# Patient Record
Sex: Female | Born: 1970 | Race: White | Hispanic: No | Marital: Single | Smoking: Current every day smoker
Health system: Southern US, Community
[De-identification: ages and names within clinical notes are randomized; demographics above are authoritative.]

## PROBLEM LIST (undated history)

## (undated) DIAGNOSIS — K8689 Other specified diseases of pancreas: Secondary | ICD-10-CM

## (undated) DIAGNOSIS — Z87898 Personal history of other specified conditions: Secondary | ICD-10-CM

## (undated) DIAGNOSIS — I81 Portal vein thrombosis: Secondary | ICD-10-CM

## (undated) HISTORY — PX: OTHER SURGICAL HISTORY: SHX169

## (undated) HISTORY — PX: ECTOPIC PREGNANCY SURGERY: SHX613

---

## 2017-07-27 ENCOUNTER — Other Ambulatory Visit: Payer: Self-pay

## 2017-07-27 ENCOUNTER — Encounter (HOSPITAL_COMMUNITY): Payer: Self-pay | Admitting: *Deleted

## 2017-07-27 ENCOUNTER — Emergency Department (HOSPITAL_COMMUNITY)
Admission: EM | Admit: 2017-07-27 | Discharge: 2017-07-27 | Disposition: A | Discharge status: Home / Self Care | Payer: Self-pay | Attending: Emergency Medicine | Admitting: Emergency Medicine

## 2017-07-27 ENCOUNTER — Emergency Department (HOSPITAL_COMMUNITY): Payer: Self-pay

## 2017-07-27 DIAGNOSIS — S93114A Dislocation of interphalangeal joint of right lesser toe(s), initial encounter: Secondary | ICD-10-CM | POA: Insufficient documentation

## 2017-07-27 DIAGNOSIS — Y999 Unspecified external cause status: Secondary | ICD-10-CM | POA: Insufficient documentation

## 2017-07-27 DIAGNOSIS — Y9389 Activity, other specified: Secondary | ICD-10-CM | POA: Insufficient documentation

## 2017-07-27 DIAGNOSIS — Y92003 Bedroom of unspecified non-institutional (private) residence as the place of occurrence of the external cause: Secondary | ICD-10-CM | POA: Insufficient documentation

## 2017-07-27 DIAGNOSIS — W010XXA Fall on same level from slipping, tripping and stumbling without subsequent striking against object, initial encounter: Secondary | ICD-10-CM | POA: Insufficient documentation

## 2017-07-27 DIAGNOSIS — S93104A Unspecified dislocation of right toe(s), initial encounter: Secondary | ICD-10-CM

## 2017-07-27 DIAGNOSIS — F17210 Nicotine dependence, cigarettes, uncomplicated: Secondary | ICD-10-CM | POA: Insufficient documentation

## 2017-07-27 MED ORDER — LIDOCAINE HCL (PF) 1 % IJ SOLN
5.0000 mL | Freq: Once | INTRAMUSCULAR | Status: AC
  Administered 2017-07-27: 5 mL
  Filled 2017-07-27: qty 5

## 2017-07-27 NOTE — ED Provider Notes (Signed)
[facility] EMERGENCY DEPARTMENT Provider Note   CSN: [id number] Arrival date & time: 07/27/17  1445     History   Chief Complaint Chief Complaint  Patient presents with  . Toe Injury    HPI Meredith Cohen is a 47 y.o. female who is previously healthy who presents with right small toe pain since last night.  Patient reports she got out of bed to go to the bathroom in the early morning hours of yesterday when her toe caught on the blanket and cause her to fall.  Her toe twisted in the blanket.  She did not hit her head or lose consciousness.  She denies any other injuries.  Her toe has had persistent swelling and pain.  She has taken ibuprofen at home without relief.  HPI  History reviewed. No pertinent past medical history.  There are no active problems to display for this patient.   History reviewed. No pertinent surgical history.   OB History   None      Home Medications    Prior to Admission medications   Not on File    Family History History reviewed. No pertinent family history.  Social History Social History   Tobacco Use  . Smoking status: Current Every Day Smoker  Substance Use Topics  . Alcohol use: Yes  . Drug use: Never     Allergies   Patient has no known allergies.   Review of Systems Review of Systems  Musculoskeletal: Positive for arthralgias and joint swelling.  Skin: Negative for wound.  Neurological: Negative for numbness.     Physical Exam Updated Vital Signs BP 115/88   Pulse 88   Temp 98.7 F (37.1 C) (Oral)   Resp 16   Ht  (1.676 m)   Wt 61.2 kg (135 lb)   SpO2 100%   BMI 21.79 kg/m   Physical Exam  Constitutional: She appears well-developed and well-nourished. No distress.  HENT:  Head: Normocephalic and atraumatic.  Mouth/Throat: Oropharynx is clear and moist. No oropharyngeal exudate.  Eyes: Pupils are equal, round, and reactive to light. Conjunctivae are normal. Right eye exhibits no  discharge. Left eye exhibits no discharge. No scleral icterus.  Neck: Normal range of motion. Neck supple.  Cardiovascular: Normal rate, regular rhythm, normal heart sounds and intact distal pulses. Exam reveals no gallop and no friction rub.  No murmur heard. Pulmonary/Chest: Effort normal and breath sounds normal. No stridor. No respiratory distress. She has no wheezes. She has no rales.  Musculoskeletal: She exhibits no edema.  Deformity noted to right fifth toe, tenderness on palpation, edema and mild ecchymosis noted Patient unable to significantly move the toe, sensation intact, cap refill less than 2 seconds  Neurological: She is alert. Coordination normal.  Skin: Skin is warm and dry. No rash noted. She is not diaphoretic. No pallor.  Psychiatric: She has a normal mood and affect.  Nursing note and vitals reviewed.    ED Treatments / Results  Labs (all labs ordered are listed, but only abnormal results are displayed) Labs Reviewed - No data to display  EKG None  Radiology Dg Foot Complete Right  Result Date: 07/27/2017 CLINICAL DATA:  Pain following fall EXAM: RIGHT FOOT COMPLETE - 3+ VIEW COMPARISON:  None. FINDINGS: Frontal, oblique, and lateral views were obtained. There is subluxation at the fifth PIP joint with the fifth middle and distal phalanges subluxed somewhat lateral to the proximal phalanx. There is not appear to be frank dislocation at  this site. No fracture is evident. No dislocation. There is mild osteoarthritic change at the first MTP joint with mild hallux valgus deformity at the first MTP joint. There is early bunion formation medially in the first MTP joint region. Other joint spaces appear normal. IMPRESSION: Subluxation at the fifth PIP joint with lateral subluxation of the middle and distal phalanges of fifth digit compared to the proximal phalanx. No frank dislocation. No fractures are evident. 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 Electronically Signed   By: Bretta Bang III M.D.   On: 07/27/2017 15:35   Dg Toe 5th Right  Result Date: 07/27/2017 CLINICAL DATA:  Post reduction for subluxation EXAM: RIGHT FIFTH TOE: 3 V COMPARISON:  Study obtained earlier in the day FINDINGS: Frontal, oblique and lateral views obtained. There is a bandage overlying the fifth digit. The previously noted subluxation at the fifth PIP joint has resolved. No fracture or dislocation evident. No appreciable joint space narrowing or erosion. IMPRESSION: Bandage overlies the fifth digit. Reduction fifth PIP joint subluxation. Currently no fracture or dislocation. No appreciable arthropathy. Electronically Signed   By: Bretta Bang III M.D.   On: 07/27/2017 19:24    Procedures Reduction of dislocation Date/Time: 07/27/2017 10:32 PM Performed by: Emi Holes, PA-C Authorized by: Emi Holes, PA-C  Consent: Verbal consent obtained. Consent given by: patient Imaging studies: imaging studies available Patient identity confirmed: verbally with patient Local anesthesia used: yes Anesthesia: digital block  Anesthesia: Local anesthesia used: yes Local Anesthetic: lidocaine 1% without epinephrine Anesthetic total: 1 mL  Sedation: Patient sedated: no  Patient tolerance: Patient tolerated the procedure well with no immediate complications Comments: Reduction of the fifth PIP of the right foot performed by me after digital block of the toe.  Postreduction films showed successful reduction.    (including critical care time)  Medications Ordered in ED Medications  lidocaine (PF) (XYLOCAINE) 1 % injection 5 mL (5 mLs Infiltration Given by Other 07/27/17 1905)     Initial Impression / Assessment and Plan / ED Course  I have reviewed the triage vital signs and the nursing notes.  Pertinent labs & imaging results that were available during my care of the patient were reviewed  by me and considered in my medical decision making (see chart for details).     Patient with subluxation at the PIP joint of the fifth digit on the right foot.  There is no fracture.  Toe was reduced in the ED after digital block performed by me.  Postreduction film shows reduction of the fifth PIP joint subluxation.  Patient was buddy taped, given postop shoe, and crutches with follow-up to orthopedics.  Return precautions discussed.  Patient understands and agrees with plan.  Patient vitals stable throughout ED course and discharged in satisfactory condition.  Final Clinical Impressions(s) / ED Diagnoses   Final diagnoses:  Dislocation of phalanx of right foot, initial encounter    ED Discharge Orders    None       Emi Holes, PA-C 07/27/17 2233    Arby Barrette, MD 08/06/17 1443

## 2017-07-27 NOTE — ED Notes (Signed)
X RAY at bedside 

## 2017-07-27 NOTE — ED Triage Notes (Signed)
Pt reports injuring right little toe last night, still has pain and swelling. Ambulatory at triage.

## 2017-07-27 NOTE — Discharge Instructions (Signed)
Keep your toes buddy taped at all times.  You can use ice 3-4 times daily alternating 20 minutes on, 20 minutes off to help with pain and swelling.  Please follow-up with Dr. Aundria Rud, the orthopedic doctor, for recheck in 1 week.  Please return the emergency department if you develop any new or worsening symptoms.

## 2017-07-27 NOTE — ED Notes (Signed)
Patient Alert and oriented to baseline. Stable and ambulatory to baseline. Patient verbalized understanding of the discharge instructions.  Patient belongings were taken by the patient.   

## 2017-11-14 ENCOUNTER — Inpatient Hospital Stay (HOSPITAL_COMMUNITY)
Admission: EM | Admit: 2017-11-14 | Discharge: 2017-11-20 | DRG: 440 | Disposition: A | Discharge status: Home / Self Care | Payer: Self-pay | Attending: Internal Medicine | Admitting: Internal Medicine

## 2017-11-14 ENCOUNTER — Encounter (HOSPITAL_COMMUNITY): Payer: Self-pay

## 2017-11-14 ENCOUNTER — Other Ambulatory Visit: Payer: Self-pay

## 2017-11-14 ENCOUNTER — Emergency Department (HOSPITAL_COMMUNITY): Payer: Self-pay

## 2017-11-14 DIAGNOSIS — M5430 Sciatica, unspecified side: Secondary | ICD-10-CM | POA: Diagnosis present

## 2017-11-14 DIAGNOSIS — Z87448 Personal history of other diseases of urinary system: Secondary | ICD-10-CM

## 2017-11-14 DIAGNOSIS — M549 Dorsalgia, unspecified: Secondary | ICD-10-CM | POA: Diagnosis present

## 2017-11-14 DIAGNOSIS — R8290 Unspecified abnormal findings in urine: Secondary | ICD-10-CM

## 2017-11-14 DIAGNOSIS — K760 Fatty (change of) liver, not elsewhere classified: Secondary | ICD-10-CM

## 2017-11-14 DIAGNOSIS — K7460 Unspecified cirrhosis of liver: Secondary | ICD-10-CM | POA: Diagnosis present

## 2017-11-14 DIAGNOSIS — L818 Other specified disorders of pigmentation: Secondary | ICD-10-CM

## 2017-11-14 DIAGNOSIS — E785 Hyperlipidemia, unspecified: Secondary | ICD-10-CM | POA: Diagnosis present

## 2017-11-14 DIAGNOSIS — Z880 Allergy status to penicillin: Secondary | ICD-10-CM

## 2017-11-14 DIAGNOSIS — Z79899 Other long term (current) drug therapy: Secondary | ICD-10-CM

## 2017-11-14 DIAGNOSIS — R112 Nausea with vomiting, unspecified: Secondary | ICD-10-CM | POA: Diagnosis not present

## 2017-11-14 DIAGNOSIS — K8689 Other specified diseases of pancreas: Secondary | ICD-10-CM | POA: Diagnosis present

## 2017-11-14 DIAGNOSIS — K861 Other chronic pancreatitis: Secondary | ICD-10-CM | POA: Diagnosis present

## 2017-11-14 DIAGNOSIS — F17210 Nicotine dependence, cigarettes, uncomplicated: Secondary | ICD-10-CM | POA: Diagnosis present

## 2017-11-14 DIAGNOSIS — K859 Acute pancreatitis without necrosis or infection, unspecified: Secondary | ICD-10-CM | POA: Diagnosis present

## 2017-11-14 DIAGNOSIS — Z720 Tobacco use: Secondary | ICD-10-CM

## 2017-11-14 DIAGNOSIS — K869 Disease of pancreas, unspecified: Principal | ICD-10-CM | POA: Diagnosis present

## 2017-11-14 DIAGNOSIS — Z86718 Personal history of other venous thrombosis and embolism: Secondary | ICD-10-CM

## 2017-11-14 DIAGNOSIS — R82998 Other abnormal findings in urine: Secondary | ICD-10-CM

## 2017-11-14 DIAGNOSIS — R1012 Left upper quadrant pain: Secondary | ICD-10-CM

## 2017-11-14 DIAGNOSIS — F1011 Alcohol abuse, in remission: Secondary | ICD-10-CM

## 2017-11-14 HISTORY — DX: Personal history of other specified conditions: Z87.898

## 2017-11-14 HISTORY — DX: Portal vein thrombosis: I81

## 2017-11-14 LAB — CBC
HCT: 43.5 % (ref 36.0–46.0)
Hemoglobin: 14.7 g/dL (ref 12.0–15.0)
MCH: 33 pg (ref 26.0–34.0)
MCHC: 33.8 g/dL (ref 30.0–36.0)
MCV: 97.5 fL (ref 78.0–100.0)
PLATELETS: 201 10*3/uL (ref 150–400)
RBC: 4.46 MIL/uL (ref 3.87–5.11)
RDW: 12.8 % (ref 11.5–15.5)
WBC: 8.3 10*3/uL (ref 4.0–10.5)

## 2017-11-14 LAB — COMPREHENSIVE METABOLIC PANEL
ALK PHOS: 78 U/L (ref 38–126)
ALT: 16 U/L (ref 0–44)
AST: 26 U/L (ref 15–41)
Albumin: 3.9 g/dL (ref 3.5–5.0)
Anion gap: 9 (ref 5–15)
BUN: 12 mg/dL (ref 6–20)
CHLORIDE: 101 mmol/L (ref 98–111)
CO2: 25 mmol/L (ref 22–32)
CREATININE: 0.75 mg/dL (ref 0.44–1.00)
Calcium: 8.9 mg/dL (ref 8.9–10.3)
GFR calc Af Amer: >60 mL/min (ref >60)
Glucose, Bld: 101 mg/dL — ABNORMAL HIGH (ref 70–99)
Potassium: 3.6 mmol/L (ref 3.5–5.1)
Sodium: 135 mmol/L (ref 135–145)
TOTAL PROTEIN: 7.2 g/dL (ref 6.5–8.1)
Total Bilirubin: 0.7 mg/dL (ref 0.3–1.2)

## 2017-11-14 LAB — URINALYSIS, ROUTINE W REFLEX MICROSCOPIC
BILIRUBIN URINE: NEGATIVE
GLUCOSE, UA: NEGATIVE mg/dL
KETONES UR: 80 mg/dL — AB
Nitrite: NEGATIVE
PH: 6 (ref 5.0–8.0)
Protein, ur: 30 mg/dL — AB
SPECIFIC GRAVITY, URINE: 1.023 (ref 1.005–1.030)

## 2017-11-14 LAB — LIPASE, BLOOD: Lipase: 59 U/L — ABNORMAL HIGH (ref 11–51)

## 2017-11-14 LAB — I-STAT BETA HCG BLOOD, ED (MC, WL, AP ONLY): I-stat hCG, quantitative: <5 m[IU]/mL (ref <5)

## 2017-11-14 MED ORDER — MORPHINE SULFATE (PF) 4 MG/ML IV SOLN
4.0000 mg | Freq: Once | INTRAVENOUS | Status: AC
  Administered 2017-11-14: 4 mg via INTRAVENOUS
  Filled 2017-11-14: qty 1

## 2017-11-14 MED ORDER — ONDANSETRON HCL 4 MG/2ML IJ SOLN: 4.0000 mg | Freq: Four times a day (QID) | INTRAMUSCULAR | Status: DC | PRN

## 2017-11-14 MED ORDER — SODIUM CHLORIDE 0.9% FLUSH
3.0000 mL | Freq: Two times a day (BID) | INTRAVENOUS | Status: DC
  Administered 2017-11-15 – 2017-11-20 (×12): 3 mL via INTRAVENOUS

## 2017-11-14 MED ORDER — SODIUM CHLORIDE 0.9 % IV SOLN
250.0000 mL | INTRAVENOUS | Status: DC | PRN
  Administered 2017-11-19: 11:00:00 via INTRAVENOUS

## 2017-11-14 MED ORDER — TRAMADOL HCL 50 MG PO TABS
50.0000 mg | ORAL_TABLET | Freq: Four times a day (QID) | ORAL | Status: DC | PRN
  Administered 2017-11-14: 50 mg via ORAL
  Filled 2017-11-14: qty 1

## 2017-11-14 MED ORDER — ACETAMINOPHEN 325 MG PO TABS
650.0000 mg | ORAL_TABLET | Freq: Four times a day (QID) | ORAL | Status: DC | PRN
  Administered 2017-11-19: 650 mg via ORAL
  Filled 2017-11-14: qty 2

## 2017-11-14 MED ORDER — SODIUM CHLORIDE 0.9 % IV BOLUS
1000.0000 mL | Freq: Once | INTRAVENOUS | Status: AC
  Administered 2017-11-14: 1000 mL via INTRAVENOUS

## 2017-11-14 MED ORDER — ENOXAPARIN SODIUM 40 MG/0.4ML ~~LOC~~ SOLN
40.0000 mg | Freq: Every day | SUBCUTANEOUS | Status: DC
  Administered 2017-11-15 – 2017-11-18 (×4): 40 mg via SUBCUTANEOUS
  Filled 2017-11-14 (×4): qty 0.4

## 2017-11-14 MED ORDER — ONDANSETRON HCL 4 MG PO TABS: 4.0000 mg | ORAL_TABLET | Freq: Four times a day (QID) | ORAL | Status: DC | PRN

## 2017-11-14 MED ORDER — KETOROLAC TROMETHAMINE 30 MG/ML IJ SOLN
30.0000 mg | Freq: Once | INTRAMUSCULAR | Status: AC
  Administered 2017-11-14: 30 mg via INTRAVENOUS
  Filled 2017-11-14: qty 1

## 2017-11-14 MED ORDER — SODIUM CHLORIDE 0.9 % IV SOLN
1.0000 g | Freq: Once | INTRAVENOUS | Status: AC
  Administered 2017-11-14: 1 g via INTRAVENOUS
  Filled 2017-11-14: qty 10

## 2017-11-14 MED ORDER — SODIUM CHLORIDE 0.9% FLUSH: 3.0000 mL | INTRAVENOUS | Status: DC | PRN

## 2017-11-14 MED ORDER — ACETAMINOPHEN 650 MG RE SUPP: 650.0000 mg | Freq: Four times a day (QID) | RECTAL | Status: DC | PRN

## 2017-11-14 NOTE — H&P (Signed)
Date: 11/14/2017               Patient Name:  Meredith Cohen MRN: [id number]  DOB: Jul 15, 1970 Age / Sex: 47 y.o., female   PCP: Patient, No Pcp Per         Medical Service: Internal Medicine Teaching Service         Attending Physician: Dr. Burns Spain, MD    First Contact: Dr. Dortha Schwalbe Pager: [phone]  Second Contact: Dr. Alinda Money Pager: [phone]       After Hours (After 5p/  First Contact Pager: [phone]  weekends / holidays): Second Contact Pager: [phone]   Chief Complaint: back and abdominal pain  History of Present Illness: Meredith Cohen is a 47 yo female with a medical history significant for acute hepatic failure and acute renal failure in the setting of portal vein thrombosis and severe alcohol abuse in 2016 who presents for back pain and abdominal pain. She reports that the pain gradually started 3 days ago in her back just below her shoulder blades. Over the past day, the pain has worsened, has radiated further down her back, and around to her upper abdomen. She describes the pain as a 9/10, band-like pressure. She has tried tylenol and icy-hot pads on her back with no relief. The pain is pleuritic and she states her ribs "feel like they are going to explode from the inside out" whenever she takes a deep breath. She denies current alcohol use. She reports that she has never felt pain like this before. She endorses lack of appetite, mild nausea, subjective fevers, chills, and fatigue that she attributes to stress at work. She denies vomiting, diarrhea, constipation, hematochezia/melena, dysuria, hematuria, weight changes, and night sweats.   Upon arrival to the ED, pt afebrile with RR 16, HR 107, BP 123/80, and O2 98%. Labs were significant for elevated lipase of 59. UA showed small Hb, 80 ketones, 30 protein, trace leuks, and many bacteria. CT abdomen showed no stones, lobular contour of the liver, and an intederminate mass in the tail of the pancreas with central calcification  and surrounding fat stranding. She received 1L NS, toradol, morphine, and rocephin for suspected UTI. Case was discussed with GI who recommended admission, further imaging with MRI, and monitoring of her LFTs and lipase.  Of note, Meredith Cohen had a long admission at [facility] in 2016 for acute hepatic failure and acute renal failure in the setting of severe alcohol abuse. She was thought to be in septic shock and have ARDS. She required intubation and pressors. Renal function improved with hydration. Liver function improved, but there was concern for residual cirrhosis. She was started on Coumadin for suspected portal vein thrombosis, but she is no longer on this medication. A 2016 abdominal CT showed normal pancreas, hepatic steatosis, possible right adnexal hemorrhagic cyst, and right iliac vein thrombus. F/u 2016 pelvic US showed right adnexal mass concerning for neoplasm. Unclear if the patient ever followed up on this.   Meds:  Current Meds  Medication Sig  . Milk Thistle 150 MG CAPS Take 450 mg by mouth daily.  . Multiple Vitamin (MULTIVITAMIN WITH MINERALS) TABS tablet Take 1 tablet by mouth daily.  Marland Kitchen omega-3 acid ethyl esters (LOVAZA) 1 g capsule Take 1 g by mouth daily.  . vitamin B-12 (CYANOCOBALAMIN) 100 MCG tablet Take 100 mcg by mouth daily.   Allergies: Allergies as of 11/14/2017 - Review Complete 11/14/2017  Allergen Reaction Noted  . Penicillins Itching 11/14/2017  Past medical history: see HPI Past surgical history: ectopic pregnancy.  Family History: Patient is adopted.  Social History: Patient lives at home with her boyfriend. 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 She smokes 5 cigarettes per day. She denies current etoh or illicit drug use, but has a documented history of alcohol abuse in [facility].   Review of Systems: A complete ROS was negative except as per HPI.   Physical Exam: Blood pressure 109/70, pulse 83, temperature (!) 97.5 F (36.4 C),  temperature source Oral, resp. rate 16, height 5\' 6"  (1.676 m), weight 63.5 kg, last menstrual period 10/15/2017, SpO2 99 %.  Constitutional: Well-developed, well-nourished, and in no distress.  Eyes: Pupils are equal, round, and reactive to light. EOM are normal.  Cardiovascular: Normal rate and regular rhythm. No murmurs, rubs, or gallops. Pulmonary/Chest: Effort normal. Clear to auscultation bilaterally. No wheezes, rales, or rhonchi. Abdominal: Bowel sounds present. Soft, non-distended. Mild tenderness to palpation in the LUQ and epigastric region.  Back: Mild TTP along the base of the scapulas bilaterally.  Ext: No lower extremity edema. Skin: Warm and dry. No rashes or wounds.  Assessment & Plan by Problem: Active Problems:   Abdominal pain   Pancreatic mass  Meredith Cohen is a 47 yo female with a medical history significant for acute hepatic failure and acute renal failure in the setting of portal vein thrombosis and severe alcohol abuse in 2016 who presents for three days of mid back and upper abdominal pain in a band-like distribution that has progressively worsened. Patient is currently afebrile and hemodynamically stable. Labs were normal except for a slightly elevated lipase. CT scan showed abnormal liver contour and an indeterminate mass in the tail of the pancreas. Her pain is most likely related to the pancreatic mass. She has no constitutional symptoms concerning for malignancy. DDx also includes portal vein thrombosis (patient has a history of this and is not currently on anticoagulation), pancreatitis (however patient is hungry and asking to eat without vomiting), UTI (less likely due to lack of urinary symptoms), kidney stone (UA shows hematuria, but less likely given the pain is in the upper abdomen/back).  Abdominal and back pain  Pancreatic mass - MRI with and without contrast to better visualize pancreas and liver - GI following  - Pain control with tramadol 50 mg q6hrs  PRN - Zofran prn for nausea - AM CMP  Abnormal UA - UA showed small Hb, 80 ketones, 30 protein, trace leuks, and many bacteria without casts. - Low concern for UTI because she is asymptomatic. - Hematuria was initially concerning for nephrolithiasis, but no stones seen on CT. - Ketones and protein may represent DM.  Plan - A1c - F/u urine culture  History of alcohol abuse - Patient denies current alcohol use. Plan - Ethanol level - CIWA  FEN: NS 4510ml/hr, regular diet, replace electrolytes as needed  DVT ppx: SubQ Lovenox Code status: FULL code  Dispo: Admit patient to Inpatient with expected length of stay greater than 2 midnights.  Signed: Dionne Anoorrell, Deborah N, MD 11/14/2017, 10:49 PM  Pager: [phone]

## 2017-11-14 NOTE — ED Provider Notes (Signed)
[facility] EMERGENCY DEPARTMENT Provider Note   CSN: [id number] Arrival date & time: 11/14/17  4098     History   Chief Complaint Chief Complaint  Patient presents with  . Abdominal Pain  . Back Pain    HPI Meredith Cohen is a 47 y.o. female with no significant past medical history presents today for evaluation of acute onset, progressively worsening back and abdominal pain.  She states that symptoms began with mild dull mid back pain 3 days ago and have progressively worsened.  The pain now radiates down to the low back and around the abdomen, left worse than right.  She states she feels bloated and as if there is a pressure in her abdomen.  Pain is constant, dull and at times sharp.  Worsens with bending.  She also notes subjective fevers and chills and nausea but denies vomiting.  Denies shortness of breath but feels as though there is a pain to the lower ribs along the costal margin.  She notes decreased appetite and subsequently decreased urine output but denies dysuria, urgency, frequency, or hematuria.  Has tried ibuprofen, over-the-counter pain medicine, and IcyHot patch to the back without relief of her symptoms.  Denies bowel or bladder incontinence, saddle anesthesia, numbness, weakness, or radiation to the legs.  Denies diarrhea, constipation, melena, hematochezia, vaginal itching, vaginal bleeding, or vaginal discharge out of the ordinary.   The history is provided by the patient.    History reviewed. No pertinent past medical history.  Patient Active Problem List   Diagnosis Date Noted  . Abdominal pain 11/14/2017  . Pancreatic mass 11/14/2017    History reviewed. No pertinent surgical history.   OB History   None      Home Medications    Prior to Admission medications   Medication Sig Start Date End Date Taking? Authorizing Provider  Milk Thistle 150 MG CAPS Take 450 mg by mouth daily.   Yes [provider]  Multiple Vitamin  (MULTIVITAMIN WITH MINERALS) TABS tablet Take 1 tablet by mouth daily.   Yes [provider]  omega-3 acid ethyl esters (LOVAZA) 1 g capsule Take 1 g by mouth daily.   Yes [provider]  vitamin B-12 (CYANOCOBALAMIN) 100 MCG tablet Take 100 mcg by mouth daily.   Yes [provider]    Family History History reviewed. No pertinent family history.  Social History Social History   Tobacco Use  . Smoking status: Current Every Day Smoker    Types: Cigarettes  Substance Use Topics  . Alcohol use: Yes  . Drug use: Never     Allergies   Penicillins   Review of Systems Review of Systems  Constitutional: Positive for chills and fever.  Respiratory: Negative for shortness of breath.   Gastrointestinal: Positive for abdominal pain and nausea. Negative for blood in stool, constipation, diarrhea and vomiting.  Genitourinary: Positive for decreased urine volume and flank pain. Negative for dysuria, frequency, hematuria, urgency, vaginal bleeding, vaginal discharge and vaginal pain.  Musculoskeletal: Positive for back pain.  All other systems reviewed and are negative.    Physical Exam Updated Vital Signs BP 120/73 (BP Location: Right Arm)   Pulse 83   Temp 99 F (37.2 C) (Oral)   Resp 16   Ht 5\' 6"  (1.676 m)   Wt 67.5 kg   LMP 10/15/2017 (Approximate)   SpO2 99%   BMI 24.02 kg/m   Physical Exam  Constitutional: She appears well-developed and well-nourished. No distress.  Resting in chair, appears uncomfortable  HENT:  Head: Normocephalic and atraumatic.  Eyes: Pupils are equal, round, and reactive to light. Conjunctivae and EOM are normal. Right eye exhibits no discharge. Left eye exhibits no discharge.  Neck: No JVD present. No tracheal deviation present.  Cardiovascular: Normal rate, regular rhythm and normal heart sounds.  Pulmonary/Chest: Effort normal and breath sounds normal. No respiratory distress.  Mild discomfort on palpation of the  costal margin anteriorly, no deformity, crepitus, ecchymosis, or flail segment.  Abdominal: Soft. Bowel sounds are normal. She exhibits no distension. There is generalized tenderness and tenderness in the left upper quadrant. There is guarding and CVA tenderness. There is no rigidity, no rebound, no tenderness at McBurney's point and negative Murphy's sign.  Maximally tender in the left upper quadrant, left CVA tenderness present.  Musculoskeletal: She exhibits no edema.       Arms: No midline spine TTP, bilateral upper paralumbar muscle tenderness, no deformity, crepitus, or step-off noted.  5/5 strength of BLE major muscle groups.   Neurological: She is alert.  Skin: Skin is warm and dry. No erythema.  Psychiatric: She has a normal mood and affect. Her behavior is normal.  Nursing note and vitals reviewed.    ED Treatments / Results  Labs (all labs ordered are listed, but only abnormal results are displayed) Labs Reviewed  LIPASE, BLOOD - Abnormal; Notable for the following components:      Result Value   Lipase 59 (*)    All other components within normal limits  COMPREHENSIVE METABOLIC PANEL - Abnormal; Notable for the following components:   Glucose, Bld 101 (*)    All other components within normal limits  URINALYSIS, ROUTINE W REFLEX MICROSCOPIC - Abnormal; Notable for the following components:   APPearance CLOUDY (*)    Hgb urine dipstick SMALL (*)    Ketones, ur 80 (*)    Protein, ur 30 (*)    Leukocytes, UA TRACE (*)    Bacteria, UA MANY (*)    All other components within normal limits  URINE CULTURE  CBC  ETHANOL  HEMOGLOBIN A1C  HIV ANTIBODY (ROUTINE TESTING W REFLEX)  COMPREHENSIVE METABOLIC PANEL  I-STAT BETA HCG BLOOD, ED ([facility], [facility], [facility] ONLY)    EKG None  Radiology Ct Renal Stone Study  Result Date: 11/14/2017 CLINICAL DATA:  Patient with flank pain. EXAM: CT ABDOMEN AND PELVIS WITHOUT CONTRAST TECHNIQUE: Multidetector CT imaging of the abdomen and pelvis  was performed following the standard protocol without IV contrast. COMPARISON:  None. FINDINGS: Lower chest: Normal heart size. Lung bases are clear. No pleural effusion. Hepatobiliary: Lobular contour of the liver. Sludge within the gallbladder lumen. No intrahepatic or extrahepatic biliary ductal dilatation. Pancreas: Within the region the pancreatic tail there is suggestion of a 2.8 x 2.3 cm mass with central calcification (image 23; series 3). Small amount of surrounding fat stranding at this location. Spleen: Unremarkable Adrenals/Urinary Tract: Normal adrenal glands. Kidneys are symmetric in size. No nephroureterolithiasis. No hydronephrosis. Urinary bladder is unremarkable. Stomach/Bowel: No abnormal bowel wall thickening or evidence for bowel obstruction. No free fluid or free intraperitoneal air. Normal appendix. Normal morphology of the stomach. Vascular/Lymphatic: Normal caliber abdominal aorta. Peripheral calcified atherosclerotic plaque. No retroperitoneal lymphadenopathy. Reproductive: Uterus and adnexal structures are unremarkable. Other: None. Musculoskeletal: Lumbar spine degenerative changes. No aggressive or acute appearing osseous lesions. IMPRESSION: 1. No nephroureterolithiasis. No hydronephrosis. No acute process within the abdomen or pelvis. 2. Indeterminate mass with central calcification within the pancreatic tail.  This needs dedicated evaluation with pancreatic protocol CT or MRI. Small amount of surrounding fat stranding. Acute superimposed process is not excluded. 3. Lobular contour of the liver which is nonspecific, cirrhotic etiology not excluded. Recommend attention for the possibility of hepatic mass on contrast-enhanced CT or MRI. Electronically Signed   By: Annia Belt M.D.   On: 11/14/2017 19:04    Procedures Procedures (including critical care time)  Medications Ordered in ED Medications  enoxaparin (LOVENOX) injection 40 mg (has no administration in time range)  sodium  chloride flush (NS) 0.9 % injection 3 mL (3 mLs Intravenous Given 11/15/17 0000)  sodium chloride flush (NS) 0.9 % injection 3 mL (has no administration in time range)  0.9 %  sodium chloride infusion (has no administration in time range)  acetaminophen (TYLENOL) tablet 650 mg (has no administration in time range)    Or  acetaminophen (TYLENOL) suppository 650 mg (has no administration in time range)  ondansetron (ZOFRAN) tablet 4 mg (has no administration in time range)    Or  ondansetron (ZOFRAN) injection 4 mg (has no administration in time range)  traMADol (ULTRAM) tablet 50 mg (50 mg Oral Given 11/14/17 2300)  sodium chloride 0.9 % bolus 1,000 mL (0 mLs Intravenous Stopped 11/14/17 1832)  ketorolac (TORADOL) 30 MG/ML injection 30 mg (30 mg Intravenous Given 11/14/17 1610)  morphine 4 MG/ML injection 4 mg (4 mg Intravenous Given 11/14/17 1610)  cefTRIAXone (ROCEPHIN) 1 g in sodium chloride 0.9 % 100 mL IVPB (0 g Intravenous Stopped 11/14/17 1925)  sodium chloride 0.9 % bolus 1,000 mL (0 mLs Intravenous Stopped 11/14/17 1956)  morphine 4 MG/ML injection 4 mg (4 mg Intravenous Given 11/14/17 2010)     Initial Impression / Assessment and Plan / ED Course  I have reviewed the triage vital signs and the nursing notes.  Pertinent labs & imaging results that were available during my care of the patient were reviewed by me and considered in my medical decision making (see chart for details).     Patient with 3-day history of progressively worsening low back and abdominal pain.  She is afebrile, initially tachycardic but vital signs otherwise stable.  She is nontoxic in appearance.  No peritoneal signs on examination of the abdomen.  Lab work reviewed by me shows no leukocytosis, no electrolyte abnormalities.  Lipase mildly elevated.  UA suggestive of possible UTI with ketonuria, proteinuria, hematuria, trace leukocytes and many bacteria.  Will culture.  Given IV Rocephin in the ED.  She was given IV  fluids, morphine, and Zofran in the ED with improvement in her pain.  CT renal stone study was obtained to rule out nephrolithiasis given the mild hematuria noted on UA.  This showed no nephro ureterolithiasis or hydronephrosis.  It did show a 2.8 x 2.3 cm mass with central calcification to the pancreatic tail with surrounding fat stranding.  I consulted gastroenterology and spoke with Dr. Levora Angel who recommended admission to the hospital for MRI and trending of LFTs and lipase.  Spoke with Dr. Avie Arenas with internal medicine teaching service who agrees to assume care of patient and bring her into the hospital for further evaluation and management.  Final Clinical Impressions(s) / ED Diagnoses   Final diagnoses:  Pancreatic mass  Abnormal urinalysis    ED Discharge Orders    None       Bennye Alm 11/15/17 0034    Benjiman Core, MD 11/15/17 [phone]

## 2017-11-14 NOTE — ED Notes (Signed)
Admitting MDs at bedside.

## 2017-11-14 NOTE — ED Triage Notes (Signed)
Pt endorses back pain that began yesterday and began radiating around to the abd on both sides this morning. Denies urinary sx, n/v/d.

## 2017-11-14 NOTE — ED Notes (Addendum)
Pt was eating dinner provided by family. Pt aware of MRI and is NPO starting now. MRI to come get pt around 2am. Pt is not claustrophobic.

## 2017-11-14 NOTE — ED Notes (Signed)
ED Provider at bedside. 

## 2017-11-15 ENCOUNTER — Inpatient Hospital Stay (HOSPITAL_COMMUNITY): Payer: Self-pay

## 2017-11-15 ENCOUNTER — Encounter (HOSPITAL_COMMUNITY): Payer: Self-pay

## 2017-11-15 DIAGNOSIS — K8689 Other specified diseases of pancreas: Secondary | ICD-10-CM | POA: Diagnosis present

## 2017-11-15 DIAGNOSIS — R109 Unspecified abdominal pain: Secondary | ICD-10-CM

## 2017-11-15 LAB — HIV ANTIBODY (ROUTINE TESTING W REFLEX): HIV Screen 4th Generation wRfx: NONREACTIVE

## 2017-11-15 LAB — GLUCOSE, CAPILLARY: GLUCOSE-CAPILLARY: 113 mg/dL — AB (ref 70–99)

## 2017-11-15 LAB — HEMOGLOBIN A1C
Hgb A1c MFr Bld: 5 % (ref 4.8–5.6)
Mean Plasma Glucose: 96.8 mg/dL

## 2017-11-15 LAB — COMPREHENSIVE METABOLIC PANEL
ALT: 14 U/L (ref 0–44)
ANION GAP: 5 (ref 5–15)
AST: 20 U/L (ref 15–41)
Albumin: 3.3 g/dL — ABNORMAL LOW (ref 3.5–5.0)
Alkaline Phosphatase: 61 U/L (ref 38–126)
BUN: 13 mg/dL (ref 6–20)
CHLORIDE: 106 mmol/L (ref 98–111)
CO2: 27 mmol/L (ref 22–32)
Calcium: 8.1 mg/dL — ABNORMAL LOW (ref 8.9–10.3)
Creatinine, Ser: 0.77 mg/dL (ref 0.44–1.00)
GFR calc non Af Amer: >60 mL/min (ref >60)
Glucose, Bld: 152 mg/dL — ABNORMAL HIGH (ref 70–99)
Potassium: 3.7 mmol/L (ref 3.5–5.1)
Sodium: 138 mmol/L (ref 135–145)
TOTAL PROTEIN: 5.7 g/dL — AB (ref 6.5–8.1)
Total Bilirubin: 0.3 mg/dL (ref 0.3–1.2)

## 2017-11-15 LAB — ETHANOL: Alcohol, Ethyl (B): <10 mg/dL (ref <10)

## 2017-11-15 MED ORDER — OXYCODONE HCL 5 MG PO TABS
5.0000 mg | ORAL_TABLET | ORAL | Status: DC | PRN
  Administered 2017-11-15 – 2017-11-20 (×28): 5 mg via ORAL
  Filled 2017-11-15 (×29): qty 1

## 2017-11-15 MED ORDER — GADOBUTROL 1 MMOL/ML IV SOLN
6.0000 mL | Freq: Once | INTRAVENOUS | Status: AC | PRN
  Administered 2017-11-15: 6 mL via INTRAVENOUS

## 2017-11-15 MED ORDER — RAMELTEON 8 MG PO TABS
8.0000 mg | ORAL_TABLET | Freq: Every day | ORAL | Status: DC
  Administered 2017-11-15 – 2017-11-19 (×6): 8 mg via ORAL
  Filled 2017-11-15 (×7): qty 1

## 2017-11-15 MED ORDER — OXYCODONE HCL 5 MG PO TABS
5.0000 mg | ORAL_TABLET | Freq: Four times a day (QID) | ORAL | Status: DC | PRN
  Administered 2017-11-15 (×2): 5 mg via ORAL
  Filled 2017-11-15 (×4): qty 1

## 2017-11-15 NOTE — Progress Notes (Signed)
Patient arrived to [facility].

## 2017-11-15 NOTE — Progress Notes (Signed)
   Subjective:   [name] was evaluated and examined at bedside this AM with husband present. Currently endorsing back pain 7/10 improved than yesterday but worsening as pain meds are starting to run out. Also exacerbated by deep breaths. Felt like 'ribs were ripped from inside to out' when the insult first occurred. Her symptoms began last night which prompted her to report to the ED. She has no prior history of abdominal pain in the past. However she mentioned of a remote history of abdominal discomfort with spicy food in epigastric region.  No hx of diarrhea. Denies fatty stools. Also reports of subjective fevers and chills of 3 day duration. No prior hx of pancreatitis. Alcohol hx include 'couple shots' off and on between 5- 10 years but no longer drinking.  No nausea, vomiting. Denies hematemesis, melena, BRBPR.  Objective:  Vital signs in last 24 hours: Vitals:   11/14/17 2115 11/14/17 2254 11/15/17 0400 11/15/17 0757  BP: 109/70 120/73 106/63 99/71  Pulse: 83   78  Resp: 16     Temp:  99 F (37.2 C) 97.9 F (36.6 C) 98 F (36.7 C)  TempSrc:  Oral Oral Oral  SpO2: 99%  98% 98%  Weight:  67.5 kg    Height:  5\' 6"  (1.676 m)     Const: In NAD, lying in bed comfortably CV: RRR, no MGR Resp: CTABL, no wheezes, crackles, rhonchi  Abd: BS+, mildly TTP at the epigastrium  Back: bilateral lower ribs moderately TTP  Assessment/Plan:  Principal Problem:   Abdominal pain  [name] is a pleasant 47 year old woman with medical history consistent of acute hepatic failure and acute renal failure secondary to portal vein thrombosis in 2016, also with history of severe alcohol abuse here for management of calcified calcified pancreatic tail mass with associated inflammatory changes indicative of probable pancreatitis.  Calcified pancreatic tail mass: DDx include acute on chronic pancreatitis vs pancreatic neuroendocrine cancer vs metastatic disease.  On further questioning this morning, she  continued to deny previous history of pancreatitis, steatorrhea. She continues to endorse band-like abdominal pain that starts from her mid thoracic anteriorly to the epigastric area. -Appreciate GI recommendation (currently considering EUS guided biopsy) -Clear liquid diet -Awaiting tissue biopsy -Pain regimen: OxyIR 5mg  prn, Tylenol  History of alcohol abuse -CIWA protocol -Ethanol level unremarkable.  JXB:JYNWGFEN:Clear liquid, replace electrolytes as needed DVT ppx: SubQ Lovenox Code status:FULL code  Dispo: Anticipated discharge in approximately 2-3 days  Jodelle Redbed Normand Damron, MD [facility] PGY-1

## 2017-11-15 NOTE — Consult Note (Signed)
Subjective:   HPI  The patient is a 47-year-old female with a history of cirrhosis of the liver and a history of severe alcohol abuse in the past but she does not drink anymore.  She started to experience severe back pain a couple of days ago and then it radiated across her entire upper abdomen. It was a bandlike severe pain. There was some mild nausea but no vomiting. She came to the emergency department for further evaluation and was found to have a slight elevation of her lipase which led to a CT scan and then MRI being done. The MRI shows a hypo-enhancing mass in the tail of the pancreas with coarse central calcification and surrounding. Pancreatic inflammatory stranding. Differential diagnosis considered his neuroendocrine tumor or pancreatic adenocarcinoma or focal acute on chronic pancreatitis. It was felt that also could be some type of calcified vascular malformation.  Review of Systems No chest pain or shortness of breath Past Medical History:  Diagnosis Date  . History of alcohol use disorder   . Portal vein thrombosis    Past Surgical History:  Procedure Laterality Date  . ECTOPIC PREGNANCY SURGERY    . Tosillectomy      Social History   Socioeconomic History  . Marital status: Single    Spouse name: Not on file  . Number of children: Not on file  . Years of education: Not on file  . Highest education level: Not on file  Occupational History  . Not on file  Social Needs  . Financial resource strain: Not on file  . Food insecurity:    Worry: Not on file    Inability: Not on file  . Transportation needs:    Medical: Not on file    Non-medical: Not on file  Tobacco Use  . Smoking status: Current Every Day Smoker    Types: Cigarettes  . Tobacco comment: 5 cigarettes/day  Substance and Sexual Activity  . Alcohol use: Not Currently  . Drug use: Never  . Sexual activity: Not on file  Lifestyle  . Physical activity:    Days per week: Not on file    Minutes per  session: Not on file  . Stress: Not on file  Relationships  . Social connections:    Talks on phone: Not on file    Gets together: Not on file    Attends religious service: Not on file    Active member of club or organization: Not on file    Attends meetings of clubs or organizations: Not on file    Relationship status: Not on file  . Intimate partner violence:    Fear of current or ex partner: Not on file    Emotionally abused: Not on file    Physically abused: Not on file    Forced sexual activity: Not on file  Other Topics Concern  . Not on file  Social History Narrative  . Not on file   family history is not on file. She was adopted.  Current Facility-Administered Medications:  .  0.9 %  sodium chloride infusion, 250 mL, Intravenous, PRN, Santos-Sanchez, Idalys, MD .  acetaminophen (TYLENOL) tablet 650 mg, 650 mg, Oral, Q6H PRN **OR** acetaminophen (TYLENOL) suppository 650 mg, 650 mg, Rectal, Q6H PRN, Santos-Sanchez, Idalys, MD .  enoxaparin (LOVENOX) injection 40 mg, 40 mg, Subcutaneous, Daily, Santos-Sanchez, Idalys, MD, 40 mg at 11/15/17 1100 .  ondansetron (ZOFRAN) tablet 4 mg, 4 mg, Oral, Q6H PRN **OR** ondansetron (ZOFRAN) injection 4 mg, 4 mg,   Intravenous, Q6H PRN, Santos-Sanchez, Idalys, MD .  oxyCODONE (Oxy IR/ROXICODONE) immediate release tablet 5 mg, 5 mg, Oral, Q6H PRN, Dorrell, Deborah N, MD, 5 mg at 11/15/17 0725 .  ramelteon (ROZEREM) tablet 8 mg, 8 mg, Oral, QHS, Dorrell, Deborah N, MD, 8 mg at 11/15/17 0105 .  sodium chloride flush (NS) 0.9 % injection 3 mL, 3 mL, Intravenous, Q12H, Santos-Sanchez, Idalys, MD, 3 mL at 11/15/17 0000 .  sodium chloride flush (NS) 0.9 % injection 3 mL, 3 mL, Intravenous, PRN, Santos-Sanchez, Idalys, MD Allergies  Allergen Reactions  . Penicillins Itching    Has patient had a PCN reaction causing immediate rash, facial/tongue/throat swelling, SOB or lightheadedness with hypotension: No Has patient had a PCN reaction causing severe  rash involving mucus membranes or skin necrosis: No Has patient had a PCN reaction that required hospitalization: No Has patient had a PCN reaction occurring within the last 10 years: No If all of the above answers are "NO", then may proceed with Cephalosporin use.      Objective:     BP 99/71 (BP Location: Right Arm)   Pulse 78   Temp 98 F (36.7 C) (Oral)   Resp 16   Ht 5' 6" (1.676 m)   Wt 67.5 kg   LMP 10/15/2017 (Approximate)   SpO2 98%   BMI 24.02 kg/m   Alert and oriented  No acute distress  Heart regular rhythm no murmurs  Lungs clear  Abdomen: Bowel sounds present, soft, there is tenderness in the epigastrium but no rebound or guarding  Laboratory No components found for: D1    Assessment:     Mass in the tail of the pancreas with associated inflammatory component consistent with pancreatitis      Plan:     Treat pancreatitis conservatively at this point. Consider EUS for further investigation. Dr. Outlaw will see the patient this weekend and give his input on this. 

## 2017-11-15 NOTE — H&P (View-Only) (Signed)
Subjective:   HPI  The patient is a 47 year old female with a history of cirrhosis of the liver and a history of severe alcohol abuse in the past but she does not drink anymore.  She started to experience severe back pain a couple of days ago and then it radiated across her entire upper abdomen. It was a bandlike severe pain. There was some mild nausea but no vomiting. She came to the emergency department for further evaluation and was found to have a slight elevation of her lipase which led to a CT scan and then MRI being done. The MRI shows a hypo-enhancing mass in the tail of the pancreas with coarse central calcification and surrounding. Pancreatic inflammatory stranding. Differential diagnosis considered his neuroendocrine tumor or pancreatic adenocarcinoma or focal acute on chronic pancreatitis. It was felt that also could be some type of calcified vascular malformation.  Review of Systems No chest pain or shortness of breath Past Medical History:  Diagnosis Date  . History of alcohol use disorder   . Portal vein thrombosis    Past Surgical History:  Procedure Laterality Date  . ECTOPIC PREGNANCY SURGERY    . Tosillectomy      Social History   Socioeconomic History  . Marital status: Single    Spouse name: Not on file  . Number of children: Not on file  . Years of education: Not on file  . Highest education level: Not on file  Occupational History  . Not on file  Social Needs  . Financial resource strain: Not on file  . Food insecurity:    Worry: Not on file    Inability: Not on file  . Transportation needs:    Medical: Not on file    Non-medical: Not on file  Tobacco Use  . Smoking status: Current Every Day Smoker    Types: Cigarettes  . Tobacco comment: 5 cigarettes/day  Substance and Sexual Activity  . Alcohol use: Not Currently  . Drug use: Never  . Sexual activity: Not on file  Lifestyle  . Physical activity:    Days per week: Not on file    Minutes per  session: Not on file  . Stress: Not on file  Relationships  . Social connections:    Talks on phone: Not on file    Gets together: Not on file    Attends religious service: Not on file    Active member of club or organization: Not on file    Attends meetings of clubs or organizations: Not on file    Relationship status: Not on file  . Intimate partner violence:    Fear of current or ex partner: Not on file    Emotionally abused: Not on file    Physically abused: Not on file    Forced sexual activity: Not on file  Other Topics Concern  . Not on file  Social History Narrative  . Not on file   family history is not on file. She was adopted.  Current Facility-Administered Medications:  .  0.9 %  sodium chloride infusion, 250 mL, Intravenous, PRN, Santos-Sanchez, Idalys, MD .  acetaminophen (TYLENOL) tablet 650 mg, 650 mg, Oral, Q6H PRN **OR** acetaminophen (TYLENOL) suppository 650 mg, 650 mg, Rectal, Q6H PRN, Santos-Sanchez, Idalys, MD .  enoxaparin (LOVENOX) injection 40 mg, 40 mg, Subcutaneous, Daily, Santos-Sanchez, Idalys, MD, 40 mg at 11/15/17 1100 .  ondansetron (ZOFRAN) tablet 4 mg, 4 mg, Oral, Q6H PRN **OR** ondansetron (ZOFRAN) injection 4 mg, 4 mg,  Intravenous, Q6H PRN, Santos-Sanchez, Idalys, MD .  oxyCODONE (Oxy IR/ROXICODONE) immediate release tablet 5 mg, 5 mg, Oral, Q6H PRN, Dorrell, Cathleen Corti, MD, 5 mg at 11/15/17 0725 .  ramelteon (ROZEREM) tablet 8 mg, 8 mg, Oral, QHS, Dorrell, Cathleen Corti, MD, 8 mg at 11/15/17 0105 .  sodium chloride flush (NS) 0.9 % injection 3 mL, 3 mL, Intravenous, Q12H, Santos-Sanchez, Idalys, MD, 3 mL at 11/15/17 0000 .  sodium chloride flush (NS) 0.9 % injection 3 mL, 3 mL, Intravenous, PRN, Burna Cash, MD Allergies  Allergen Reactions  . Penicillins Itching    Has patient had a PCN reaction causing immediate rash, facial/tongue/throat swelling, SOB or lightheadedness with hypotension: No Has patient had a PCN reaction causing severe  rash involving mucus membranes or skin necrosis: No Has patient had a PCN reaction that required hospitalization: No Has patient had a PCN reaction occurring within the last 10 years: No If all of the above answers are "NO", then may proceed with Cephalosporin use.      Objective:     BP 99/71 (BP Location: Right Arm)   Pulse 78   Temp 98 F (36.7 C) (Oral)   Resp 16   Ht 5\' 6"  (1.676 m)   Wt 67.5 kg   LMP 10/15/2017 (Approximate)   SpO2 98%   BMI 24.02 kg/m   Alert and oriented  No acute distress  Heart regular rhythm no murmurs  Lungs clear  Abdomen: Bowel sounds present, soft, there is tenderness in the epigastrium but no rebound or guarding  Laboratory No components found for: D1    Assessment:     Mass in the tail of the pancreas with associated inflammatory component consistent with pancreatitis      Plan:     Treat pancreatitis conservatively at this point. Consider EUS for further investigation. Dr. Dulce Sellar will see the patient this weekend and give his input on this.

## 2017-11-16 ENCOUNTER — Encounter (HOSPITAL_COMMUNITY): Payer: Self-pay | Admitting: *Deleted

## 2017-11-16 LAB — URINE CULTURE

## 2017-11-16 LAB — GLUCOSE, CAPILLARY: Glucose-Capillary: 86 mg/dL (ref 70–99)

## 2017-11-16 NOTE — Plan of Care (Signed)
  Problem: Spiritual Needs Goal: Ability to function at adequate level Outcome: Progressing   Problem: Education: Goal: Knowledge of General Education information will improve Description Including pain rating scale, medication(s)/side effects and non-pharmacologic comfort measures Outcome: Progressing   Problem: Health Behavior/Discharge Planning: Goal: Ability to manage health-related needs will improve Outcome: Progressing   Problem: Clinical Measurements: Goal: Ability to maintain clinical measurements within normal limits will improve Outcome: Progressing   Problem: Clinical Measurements: Goal: Will remain free from infection Outcome: Progressing   Problem: Clinical Measurements: Goal: Diagnostic test results will improve Outcome: Progressing   Problem: Clinical Measurements: Goal: Respiratory complications will improve Outcome: Progressing   Problem: Clinical Measurements: Goal: Cardiovascular complication will be avoided Outcome: Progressing

## 2017-11-16 NOTE — Progress Notes (Addendum)
   Subjective:   [name] was seen on morning rounds. She states he pain remains controlled on current medication regimen and she has even tried to space the medication out further than it is available. She states her pain is less in her back and mor in her abdomen, just under her ribs, today. Denies nausea, vomiting, fevers or chills.   Objective:  Vital signs in last 24 hours: Vitals:   11/16/17 0003 11/16/17 0428 11/16/17 0904 11/16/17 1158  BP: (!) 101/51 96/61 94/65  102/60  Pulse: 64 94 66 81  Resp:      Temp: 98.1 F (36.7 C) 99 F (37.2 C) 98.2 F (36.8 C) 98.6 F (37 C)  TempSrc: Oral Oral Oral   SpO2: 92% 95% 93% 100%  Weight:      Height:       Const: In NAD, sitting in bed comfortably CV: RRR, no M/G/R Resp: CTABL, no wheezes, crackles, rhonchi  Abd: BS+, mildly TTP diffusely Back: bilateral lower ribs moderately TTP  Assessment/Plan:  Principal Problem:   Abdominal pain Active Problems:   Pancreatic mass  47 yo F w/ Hx of acute hepatic failure and acute renal failure secondary to portal vein thrombosis in 2016, and alcohol abuse who presented with pain and was found to have calcified pancreatic tail mass.  Calcified pancreatic tail mass: DDx include acute on chronic pancreatitis vs pancreatic neuroendocrine cancer vs metastatic disease. No history of pancreatitis. Pain more in her abdomen than back today, remain controlled. - Appreciate GI recommendations - Likely EUS early next week, for evaluation and possible biopsy - Full liquid diet, advance slowly - Pain regimen: OxyIR 5mg  prn, Tylenol  History of alcohol use: CIWA protocol  ZOX:WRUEAFEN:Clear liquid, replace electrolytes as needed DVT ppx: SubQ Lovenox Code status:FULL code  Dispo: Anticipated discharge in approximately 3-4 days  Ginette OttoAlec Melvin, DO IM PGY-2 Pager: [phone]

## 2017-11-16 NOTE — Progress Notes (Signed)
Subjective: Several days back and abdominal pain; is improving on pain medications.  Objective: Vital signs in last 24 hours: Temp:  [97.9 F (36.6 C)-99 F (37.2 C)] 98.2 F (36.8 C) (09/14 0904) Pulse Rate:  [64-94] 66 (09/14 0904) BP: (93-119)/(51-72) 94/65 (09/14 0904) SpO2:  [92 %-99 %] 93 % (09/14 0904) Weight change:  Last BM Date: 11/13/17  PE: GEN:  NAD ABD:  Generalized upper abdominal tenderness without peritonitis  Lab Results: CBC    Component Value Date/Time   WBC 8.3 11/14/2017 1008   RBC 4.46 11/14/2017 1008   HGB 14.7 11/14/2017 1008   HCT 43.5 11/14/2017 1008   PLT 201 11/14/2017 1008   MCV 97.5 11/14/2017 1008   MCH 33.0 11/14/2017 1008   MCHC 33.8 11/14/2017 1008   RDW 12.8 11/14/2017 1008   CMP     Component Value Date/Time   NA 138 11/14/2017 2356   K 3.7 11/14/2017 2356   CL 106 11/14/2017 2356   CO2 27 11/14/2017 2356   GLUCOSE 152 (H) 11/14/2017 2356   BUN 13 11/14/2017 2356   CREATININE 0.77 11/14/2017 2356   CALCIUM 8.1 (L) 11/14/2017 2356   PROT 5.7 (L) 11/14/2017 2356   ALBUMIN 3.3 (L) 11/14/2017 2356   AST 20 11/14/2017 2356   ALT 14 11/14/2017 2356   ALKPHOS 61 11/14/2017 2356   BILITOT 0.3 11/14/2017 2356   GFRNONAA >60 11/14/2017 2356   GFRAA >60 11/14/2017 2356   Assessment:  1.  Alcohol abuse, abstinent now. 2.  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 3.  Abdominal pain. 4.  Abnormal imaging:  Pancreatitis, pancreatic tail lesion.  Plan:  1.  Advance diet slowly. 2.  Needs EUS early next week, timing pending anesthesia availability. 3.  [facility] will revisit Monday 9/16.   Freddy JakschOUTLAW,Ayshia Gramlich M 11/16/2017, 10:41 AM   Cell [phone] If no answer or after 5 PM call [phone]

## 2017-11-17 LAB — GLUCOSE, CAPILLARY: GLUCOSE-CAPILLARY: 130 mg/dL — AB (ref 70–99)

## 2017-11-17 NOTE — Progress Notes (Signed)
   Subjective:   Overnight: no acute events reported   Today, [name] was examined at bedside and was found lying pleasantly in bed. She reports that her pain was beginning to excalate as she had not received her pain medications this morning. Also, she complains of a new left-sided abdominal pain with radiation to the left hip. However denies fevers, chills, hematuria, numbness or tingling and state that it feels like a sciatica pain that she had a coupe years ago. Currently, she dose not report of nausea, vomiting,   Objective:  Vital signs in last 24 hours: Vitals:   11/16/17 1640 11/16/17 2010 11/17/17 0330 11/17/17 0754  BP: (!) 91/58 (!) 110/53 (!) 92/49 97/68  Pulse: 71 87 65 99  Resp:   15 16  Temp: 98.7 F (37.1 C) 98.6 F (37 C) 97.8 F (36.6 C)   TempSrc:   Axillary   SpO2: 97% 100% 95% 93%  Weight:      Height:       Const: In NAD, lying comfortably in bed  CV: RRR, no MGR Resp: CTABL, no wheezes, crackles, rhonchi  Back: No CVA tenderness Abd: mildly TTP at the lower ribs bilaterally  Ext: No edema   Assessment/Plan:  Principal Problem:   Pancreatic mass Active Problems:   Abdominal pain  [name] is a 47 year old woman with history of acute hepatic failure and acute renal failure secondary to portal vein thrombosis in 2016, and alcohol abuse here for management of calcified pancreatic tail mass.  Calcified pancreatic tail mass: Complained of gradually increasing pain this morning but had not received medication yet. Still unclear etiology but DDx include acute on chronic pancreatitis vs pancreatic neuroendocrine cancer vs metastatic disease.  - Appreciate GI recommendations - EUS with biopsy on Monday or Tuesday.  - Full liquid diet, advance slowly as tolerated - Pain regimen: OxyIR 5mg  prn, Tylenol  History of alcohol use: CIWA protocol  FEN: Full liquid, replace electrolytes as needed DVT ZDG:LOVFppx:SubQ Lovenox Code status:FULL code  Anticipate  discharge in 3-4 days  Yvette RackAgyei, Jodeen Mclin K, MD 11/17/2017, 8:36 AM Pager: [phone] [facility] PGY-1

## 2017-11-18 DIAGNOSIS — M5430 Sciatica, unspecified side: Secondary | ICD-10-CM

## 2017-11-18 LAB — COMPREHENSIVE METABOLIC PANEL
ALBUMIN: 3.5 g/dL (ref 3.5–5.0)
ALT: 14 U/L (ref 0–44)
ANION GAP: 6 (ref 5–15)
AST: 21 U/L (ref 15–41)
Alkaline Phosphatase: 60 U/L (ref 38–126)
BUN: 8 mg/dL (ref 6–20)
CHLORIDE: 104 mmol/L (ref 98–111)
CO2: 30 mmol/L (ref 22–32)
Calcium: 9.3 mg/dL (ref 8.9–10.3)
Creatinine, Ser: 0.77 mg/dL (ref 0.44–1.00)
GFR calc Af Amer: >60 mL/min (ref >60)
GFR calc non Af Amer: >60 mL/min (ref >60)
GLUCOSE: 102 mg/dL — AB (ref 70–99)
Potassium: 4.4 mmol/L (ref 3.5–5.1)
Sodium: 140 mmol/L (ref 135–145)
Total Bilirubin: 0.2 mg/dL — ABNORMAL LOW (ref 0.3–1.2)
Total Protein: 6.5 g/dL (ref 6.5–8.1)

## 2017-11-18 LAB — CBC
HCT: 41 % (ref 36.0–46.0)
HEMOGLOBIN: 13.5 g/dL (ref 12.0–15.0)
MCH: 32.5 pg (ref 26.0–34.0)
MCHC: 32.9 g/dL (ref 30.0–36.0)
MCV: 98.6 fL (ref 78.0–100.0)
Platelets: 204 10*3/uL (ref 150–400)
RBC: 4.16 MIL/uL (ref 3.87–5.11)
RDW: 12.4 % (ref 11.5–15.5)
WBC: 6.1 10*3/uL (ref 4.0–10.5)

## 2017-11-18 LAB — PROTIME-INR
INR: 1.01
Prothrombin Time: 13.2 seconds (ref 11.4–15.2)

## 2017-11-18 LAB — GLUCOSE, CAPILLARY: Glucose-Capillary: 101 mg/dL — ABNORMAL HIGH (ref 70–99)

## 2017-11-18 MED ORDER — ENOXAPARIN SODIUM 40 MG/0.4ML ~~LOC~~ SOLN
40.0000 mg | Freq: Every day | SUBCUTANEOUS | Status: DC
  Administered 2017-11-20: 40 mg via SUBCUTANEOUS
  Filled 2017-11-18: qty 0.4

## 2017-11-18 NOTE — Progress Notes (Signed)
   11/18/17 1000  Clinical Encounter Type  Visited With Patient  Visit Type Initial;Pre-op  Referral From Patient;Nurse  Spiritual Encounters  Spiritual Needs Prayer  Stress Factors  Patient Stress Factors Health changes  Patient was interested in having AD, but chaplain learned she had created her own documents.  They were unsigned, and included a will. Patient expressed interest in seeing how her documents that she created differed from the AD that was provided by the hospital and she wanted to review these decisions again with health care agent she wanted to appoint before they were signed and notarized.  Chaplain said if she wanted to execute the hospital's AD later in the day she could return. Patient is facing surgery either today or tomorrow and chaplain offered prayer for her procedure.  Rev. Lynnell ChadVirginia Kristine Cohen

## 2017-11-18 NOTE — Progress Notes (Signed)
Subjective: Pain controlled with analgesics.  Objective: Vital signs in last 24 hours: Temp:  [98.1 F (36.7 C)-98.7 F (37.1 C)] 98.1 F (36.7 C) (09/16 0729) Pulse Rate:  [72-89] 76 (09/16 0731) Resp:  [15-18] 16 (09/16 0731) BP: (88-104)/(51-75) 95/53 (09/16 0731) SpO2:  [96 %-99 %] 97 % (09/16 0731) Weight change:  Last BM Date: 11/14/17  PE: GEN:  NAD  Lab Results: CBC    Component Value Date/Time   WBC 6.1 11/18/2017 0738   RBC 4.16 11/18/2017 0738   HGB 13.5 11/18/2017 0738   HCT 41.0 11/18/2017 0738   PLT 204 11/18/2017 0738   MCV 98.6 11/18/2017 0738   MCH 32.5 11/18/2017 0738   MCHC 32.9 11/18/2017 0738   RDW 12.4 11/18/2017 0738   CMP     Component Value Date/Time   NA 140 11/18/2017 0738   K 4.4 11/18/2017 0738   CL 104 11/18/2017 0738   CO2 30 11/18/2017 0738   GLUCOSE 102 (H) 11/18/2017 0738   BUN 8 11/18/2017 0738   CREATININE 0.77 11/18/2017 0738   CALCIUM 9.3 11/18/2017 0738   PROT 6.5 11/18/2017 0738   ALBUMIN 3.5 11/18/2017 0738   AST 21 11/18/2017 0738   ALT 14 11/18/2017 0738   ALKPHOS 60 11/18/2017 0738   BILITOT 0.2 (L) 11/18/2017 0738   GFRNONAA >60 11/18/2017 0738   GFRAA >60 11/18/2017 0738   Assessment:  1.  Alcohol abuse, abstinent now. 2.  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 3.  Abdominal pain. 4.  Abnormal imaging:  Pancreatitis, pancreatic tail lesion.  Plan:  1.  EGD + EUS +/- FNA planned tomorrow morning. 2.  Advance diet as tolerated. 3.  Next step in management pending EUS findings. 4.  [facility] will follow.   Freddy JakschOUTLAW,Tayshun Gappa M 11/18/2017, 10:50 AM   Cell [phone] If no answer or after 5 PM call [phone]

## 2017-11-18 NOTE — Progress Notes (Signed)
  Date: 11/18/2017  Patient name: Meredith Cohen  Medical record number: [id number]  Date of birth: [birth year]   I have seen and evaluated this patient and I have discussed the plan of care with the house staff. Please see their note for complete details. I concur with their findings with the following additions/corrections: [name] was seen this morning with the team on rounds.  Her abdominal pain is well controlled with her Percocet every 4 hours.  However, she is now experiencing sciatica for the first time in over 20 years.  She had sciatica 20 years ago when pregnant with her daughter and it resolved after childbirth.  She states heating pads and moving around helps her sciatica.  The Percocet is not helping with her sciatica.  GI is planning for an EUS tomorrow morning and we will await results.  I would suspect she can go home after the biopsy as she is tolerating food and has good pain control on oral opioids.  We will however need to discuss this with GI.  Burns SpainButcher, Koki Buxton A, MD 11/18/2017, 11:55 AM

## 2017-11-18 NOTE — Progress Notes (Signed)
   11/18/17 1430  Clinical Encounter Type  Visited With Patient and family together  Visit Type Pre-op;Other (Comment);Social support  Merchant navy officerAdvance Directives (For Healthcare)  Does Patient Have a Medical Advance Directive? Yes  Type of Estate agentAdvance Directive Healthcare Power of [place];Living will  Copy of Healthcare Power of Attorney in Chart? Yes  Copy of Living Will in Chart? Yes  Chaplain visited the patient this morning. She had her own AD written up, but not signed.  Chaplain presented her the AD document that the hospital uses as another option.  Patient reviewed the alternative document and decided to go with the one that she herself had drafted.  After consultation with the spiritual care office, her AD was completed and given to the secretary for inclusion in her chart. Rev. Lynnell ChadVirginia Massie Mees

## 2017-11-18 NOTE — Progress Notes (Signed)
   Subjective:   Overnight: No acute events  Today, [name] was examined at bedside and states abdominal pain is improving but having significant back pain. Feels like 'sciatic nerve is getting pinched.' Reports that the pain is radiating down to lower extremities and describes quality as 'electric.' She mentions she has history of sciatica while pregnant in the past. Medication seems to be not helping and has been trying to use heating packs and massaging it but continuing to endorse severe pain. Tolerated meals without nausea vomiting yesterday. Upon further questioning she denied lumps on self breast exam and reports that her last pap smear last year was normal.   Objective:  Vital signs in last 24 hours: Vitals:   11/17/17 2008 11/18/17 0543 11/18/17 0729 11/18/17 0731  BP: (!) 88/51 (!) 95/56  (!) 95/53  Pulse: 72 73  76  Resp:  15  16  Temp: 98.7 F (37.1 C) 98.5 F (36.9 C) 98.1 F (36.7 C)   TempSrc: Oral Axillary    SpO2:  96%  97%  Weight:      Height:       Const: In NAD,lying in bed CV: RRR, no MGR Resp: CTABL, no wheezes, crackles, rhonchi  Abd: Mildly TTP at the left upper and left mid abdomen, no guarding or rigidity, BS+  Assessment/Plan:  Principal Problem:   Pancreatic mass Active Problems:   Abdominal pain   Calcified pancreatic tail mass: Her abdominal pain are stable and well controlled with OxyIR. She denies nausea or vomiting. Remains afebrile with no signs or evidence of systemic infection.  - Appreciate GI recommendations  *EGD + EUS +/- FNA planned on 11/19/2016 -Soft diet, advance slowly as tolerated - Pain regimen: OxyIR 5mg  prn, Tylenol - Zofran prn nausea and vomiting    FEN: Soft diet, replace electrolytes as needed DVT UJW:JXBJppx:SubQ Lovenox Code status:FULL code  Anticipate discharge in 3-4 days  Meredith Cohen, Meredith Demario K, MD 11/18/2017 Pager: [phone] [facility] PGY-1

## 2017-11-19 ENCOUNTER — Encounter (HOSPITAL_COMMUNITY)
Admission: EM | Disposition: A | Discharge status: Home / Self Care | Payer: Self-pay | Source: Home / Self Care | Attending: Internal Medicine

## 2017-11-19 ENCOUNTER — Inpatient Hospital Stay (HOSPITAL_COMMUNITY): Payer: Self-pay | Admitting: Certified Registered Nurse Anesthetist

## 2017-11-19 ENCOUNTER — Encounter (HOSPITAL_COMMUNITY): Payer: Self-pay | Admitting: *Deleted

## 2017-11-19 HISTORY — PX: ESOPHAGOGASTRODUODENOSCOPY (EGD) WITH PROPOFOL: SHX5813

## 2017-11-19 HISTORY — PX: EUS: SHX5427

## 2017-11-19 HISTORY — PX: FINE NEEDLE ASPIRATION: SHX6590

## 2017-11-19 LAB — GLUCOSE, CAPILLARY: GLUCOSE-CAPILLARY: 97 mg/dL (ref 70–99)

## 2017-11-19 SURGERY — UPPER ENDOSCOPIC ULTRASOUND (EUS) LINEAR
Anesthesia: Monitor Anesthesia Care

## 2017-11-19 MED ORDER — PROPOFOL 10 MG/ML IV BOLUS
INTRAVENOUS | Status: DC | PRN
  Administered 2017-11-19: 20 mg via INTRAVENOUS

## 2017-11-19 MED ORDER — LIDOCAINE HCL (CARDIAC) PF 100 MG/5ML IV SOSY
PREFILLED_SYRINGE | INTRAVENOUS | Status: DC | PRN
  Administered 2017-11-19: 80 mg via INTRAVENOUS

## 2017-11-19 MED ORDER — CIPROFLOXACIN IN D5W 400 MG/200ML IV SOLN
INTRAVENOUS | Status: AC
  Filled 2017-11-19: qty 200

## 2017-11-19 MED ORDER — PROPOFOL 500 MG/50ML IV EMUL
INTRAVENOUS | Status: DC | PRN
  Administered 2017-11-19: 150 ug/kg/min via INTRAVENOUS
  Administered 2017-11-19: 11:00:00 via INTRAVENOUS

## 2017-11-19 MED ORDER — ACETAMINOPHEN 325 MG PO TABS
650.0000 mg | ORAL_TABLET | Freq: Four times a day (QID) | ORAL | Status: DC
  Administered 2017-11-19 – 2017-11-20 (×3): 650 mg via ORAL
  Filled 2017-11-19 (×3): qty 2

## 2017-11-19 MED ORDER — PHENYLEPHRINE HCL 10 MG/ML IJ SOLN
INTRAMUSCULAR | Status: DC | PRN
  Administered 2017-11-19: 120 ug via INTRAVENOUS
  Administered 2017-11-19 (×5): 80 ug via INTRAVENOUS

## 2017-11-19 MED ORDER — LIDOCAINE 5 % EX PTCH
1.0000 | MEDICATED_PATCH | CUTANEOUS | Status: DC
  Administered 2017-11-19: 1 via TRANSDERMAL
  Filled 2017-11-19 (×2): qty 1

## 2017-11-19 MED ORDER — CIPROFLOXACIN IN D5W 400 MG/200ML IV SOLN
INTRAVENOUS | Status: DC | PRN
  Administered 2017-11-19: 400 mg via INTRAVENOUS

## 2017-11-19 MED ORDER — ACETAMINOPHEN 650 MG RE SUPP: 650.0000 mg | Freq: Four times a day (QID) | RECTAL | Status: DC

## 2017-11-19 MED ORDER — SODIUM CHLORIDE 0.9 % IV SOLN: INTRAVENOUS | Status: DC

## 2017-11-19 NOTE — Consult Note (Signed)
Reason for Consult: Pancreatic mass Referring Physician: Dr.Obed Blossom Hoops Chief complaint: Back pain radiating to both sides, no nausea vomiting diarrhea or UTI symptoms    Meredith Cohen is an 47 y.o. female.   HPI: Patient is a 47 year old female who presented with progressive back and abdominal pain.  She noted the symptoms started with a mild dull ache in her back about 3 days ago.  It became progressively worse and radiated down her back and into her abdomen.  Left side was more severe than the right.  She also felt bloated pain was constant, dull, occasionally sharp.  Symptoms were worse with bending over.  She tried over-the-counter ibuprofen, and icy hot patches without relief she denied any bowel or bladder symptoms this or lower extremity discomfort.  No diarrhea constipation melena or hematochezia.  No vaginal itching bleeding or discharge.    Patient was admitted on 11/14/2017 patient was afebrile vital signs were stable.  Admission labs showed a slightly elevated glucose of 152, +8.1, albumin 3.3, lipase of 59, total protein of 5.7 the remainder of this study was normal.  WBC 8.3, hemoglobin 14.7, hematocrit 43.5, platelets 201,000.  Urinalysis was nitrate negative, many bacteria, 6-10 white cells and red cells per high-powered field.  Urine culture shows multiple species.  CT without contrast on 11/14/2017: Shows intermediate mass with central calcification in the pancreatic tail lobular contour of the liver which is nonspecific.  No nephrolithiasis no hydronephrosis.  MR I of the abdomen the following day showed a hypoenhancing female in the tail of pancreas with central calcification and surrounding peripancreatic inflammatory stranding.  The markings suggested this would be an unusual feature for a pancreatic carcinoma but a possible neuroendocrine cancer was a consideration.  There is also a question of an acute on chronic pancreatitis.  Hepatic cirrhosis with some degree of fibrosis and no  biliary dilatation was noted along with some aortic atherosclerosis.    Patient was seen in consultation on 11/15/2017 by Dr. Anson Fret and Dr. Arta Silence for GI. Dr. Arta Silence took pt to Endoscopy today for an EUS.  Endoscopic exam of the esophagus, the stomach, the first and second portions of the duodenum were all normal.  Ultrasound shows diffuse abnormal echotexture in the left lower lobe of the liver characterized by hyperechoic appearance.  The pancreatic duct had an irregular contour appearance in the genu of the pancreas, body the pancreas and tail of pancreas.  Endoscopic exam of the pancreas shows moderate chronic pancreatitis in the genu of the pancreas, pancreatic body and the pancreatic tail.  Pancreatic duct measured up to 3 mm in diameter there was no lymphadenopathy.  A fine-needle aspiration of the pancreatic tail mass was performed; it was his opinion this could be a chronic benign cystic lesion, versus a necrotic endocrine or adenocarcinoma.     We are asked to see in consultation.  Pathology is pending.  Past Medical History:  Diagnosis Date  . History of alcohol use disorder -currently abstinent   . Portal vein thrombosis     Past Surgical History:  Procedure Laterality Date  . ECTOPIC PREGNANCY SURGERY    . Tosillectomy       Family History  Adopted: Yes    Social History:  reports that she has been smoking cigarettes. She has never used smokeless tobacco. She reports that she drank alcohol. She reports that she does not use drugs. Tobacco: 25 years less than one pack per day ETOH:  Since age 73;patient  is unclear about the date she discontinued use; 2-3 months without alcohol best estimate - NO hx of DT/AA Drugs: None reported  She is single with a  boyfriend who is present.  She works as a Mudlogger.   Allergies:  Allergies  Allergen Reactions  . Penicillins Itching    Has patient had a PCN reaction causing immediate rash,  facial/tongue/throat swelling, SOB or lightheadedness with hypotension: No Has patient had a PCN reaction causing severe rash involving mucus membranes or skin necrosis: No Has patient had a PCN reaction that required hospitalization: No Has patient had a PCN reaction occurring within the last 10 years: No If all of the above answers are "NO", then may proceed with Cephalosporin use.    Prior to Admission medications   Medication Sig Start Date End Date Taking? Authorizing Provider  Milk Thistle 150 MG CAPS Take 450 mg by mouth daily.   Yes [provider]  Multiple Vitamin (MULTIVITAMIN WITH MINERALS) TABS tablet Take 1 tablet by mouth daily.   Yes [provider]  omega-3 acid ethyl esters (LOVAZA) 1 g capsule Take 1 g by mouth daily.  No taking this medciine   [provider]  vitamin B-12 (CYANOCOBALAMIN) 100 MCG tablet Take 100 mcg by mouth daily.   Yes [provider]   . sodium chloride      Results for orders placed or performed during the hospital encounter of 11/14/17 (from the past 48 hour(s))  Comprehensive metabolic panel     Status: Abnormal   Collection Time: 11/18/17  7:38 AM  Result Value Ref Range   Sodium 140 135 - 145 mmol/L   Potassium 4.4 3.5 - 5.1 mmol/L   Chloride 104 98 - 111 mmol/L   CO2 30 22 - 32 mmol/L   Glucose, Bld 102 (H) 70 - 99 mg/dL   BUN 8 6 - 20 mg/dL   Creatinine, Ser 0.77 0.44 - 1.00 mg/dL   Calcium 9.3 8.9 - 10.3 mg/dL   Total Protein 6.5 6.5 - 8.1 g/dL   Albumin 3.5 3.5 - 5.0 g/dL   AST 21 15 - 41 U/L   ALT 14 0 - 44 U/L   Alkaline Phosphatase 60 38 - 126 U/L   Total Bilirubin 0.2 (L) 0.3 - 1.2 mg/dL   GFR calc non Af Amer >60 >60 mL/min   GFR calc Af Amer >60 >60 mL/min    Comment: (NOTE) The eGFR has been calculated using the CKD EPI equation. This calculation has not been validated in all clinical situations. eGFR's persistently <60 mL/min signify possible Chronic Kidney Disease.    Anion gap 6  5 - 15    Comment: Performed at [facility] [address], [place] [address]  CBC     Status: None   Collection Time: 11/18/17  7:38 AM  Result Value Ref Range   WBC 6.1 4.0 - 10.5 K/uL   RBC 4.16 3.87 - 5.11 MIL/uL   Hemoglobin 13.5 12.0 - 15.0 g/dL   HCT 41.0 36.0 - 46.0 %   MCV 98.6 78.0 - 100.0 fL   MCH 32.5 26.0 - 34.0 pg   MCHC 32.9 30.0 - 36.0 g/dL   RDW 12.4 11.5 - 15.5 %   Platelets 204 150 - 400 K/uL    Comment: Performed at [facility], [place] [address] [place],  [address]  Protime-INR     Status: None   Collection Time:  11/18/17  7:38 AM  Result Value Ref Range   Prothrombin Time 13.2 11.4 - 15.2 seconds   INR 1.01     Comment: Performed at [facility] [address], [place], [place] [address]  Glucose, capillary     Status: Abnormal   Collection Time: 11/18/17  7:55 AM  Result Value Ref Range   Glucose-Capillary 101 (H) 70 - 99 mg/dL   Comment 1 Notify RN    Comment 2 Document in Chart   Glucose, capillary     Status: None   Collection Time: 11/19/17  8:39 AM  Result Value Ref Range   Glucose-Capillary 97 70 - 99 mg/dL    No results found.  Review of Systems  Constitutional: Positive for chills. Negative for diaphoresis, fever, malaise/fatigue and weight loss.  HENT: Negative.   Eyes: Negative.   Respiratory: Negative.   Cardiovascular: Negative.   Gastrointestinal: Positive for abdominal pain (abdominal discomfort lower abdomen, and comes from the back) and heartburn (rarely). Negative for blood in stool, constipation, diarrhea, melena, nausea and vomiting.  Genitourinary: Negative.   Musculoskeletal: Positive for back pain (Back pain is from below the scapula down the back to the iliac crest level. ). Negative for falls, joint pain, myalgias and neck pain.  Skin: Negative.   Neurological: Negative.   Endo/Heme/Allergies: Negative.   Psychiatric/Behavioral: Negative.    Blood pressure (!) 94/49, pulse 94,  temperature 97.7 F (36.5 C), temperature source Oral, resp. rate 16, height 5' 6"  (1.676 m), weight 67.5 kg, last menstrual period 10/15/2017, SpO2 99 %. Physical Exam  Constitutional: She is oriented to person, place, and time. She appears well-developed and well-nourished. No distress.  She still complaining of some back pain.  She is recently had Tylenol and oxycodone for this.  Abdominal pain has improved but points to some discomfort more on the left lower quadrant than any place else.  HENT:  Head: Normocephalic.  Mouth/Throat: Oropharynx is clear and moist. No oropharyngeal exudate.  Eyes: Right eye exhibits no discharge. Left eye exhibits no discharge. No scleral icterus.  Pupils are equal  Neck: Normal range of motion. Neck supple. No JVD present. No tracheal deviation present. No thyromegaly present.  Cardiovascular: Normal rate, regular rhythm, normal heart sounds and intact distal pulses.  No murmur heard. Respiratory: Effort normal and breath sounds normal. No respiratory distress. She has no wheezes. She has no rales. She exhibits no tenderness.  GI: Soft. Bowel sounds are normal. She exhibits no distension and no mass. There is no tenderness (Minimal tenderness left lower quadrant.). There is no rebound and no guarding.  Musculoskeletal: She exhibits no edema or tenderness.  Lymphadenopathy:    She has no cervical adenopathy.  Neurological: She is alert and oriented to person, place, and time. No cranial nerve deficit.  Skin: Skin is warm and dry. No rash noted. She is not diaphoretic. No erythema. No pallor.  Psychiatric: She has a normal mood and affect. Her behavior is normal. Judgment and thought content normal.    Assessment/Plan: Back and abdominal pain 2.9 x 2.2 hypoenhancing mass tip of the tail of the pancreas with peripancreatic inflammatory stranding - Possible pancreatitis -  Hepatic cirrhosis with some degree of fibrosis, no biliary dilatation. History of  alcohol use - Tobacco use -   Plan:  Check labs in AM, including lipid panel, AFP, CA 19, CEA Chromagen A, lipase.  Await pathology and we will follow with you.  Franz Svec 11/19/2017, 1:05 PM

## 2017-11-19 NOTE — Discharge Summary (Signed)
Name: Meredith Cohen MRN: [id number] DOB: Sep 19, 1970 47 y.o. PCP: Patient, No Pcp Per  Date of Admission: 11/14/2017  9:27 AM Date of Discharge: 11/20/2017 Attending Physician: Burns Spain, MD   Discharge Diagnosis: 1. Calcified pancreatic tail mass  Discharge Medications: Allergies as of 11/20/2017      Reactions   Penicillins Itching   Has patient had a PCN reaction causing immediate rash, facial/tongue/throat swelling, SOB or lightheadedness with hypotension: No Has patient had a PCN reaction causing severe rash involving mucus membranes or skin necrosis: No Has patient had a PCN reaction that required hospitalization: No Has patient had a PCN reaction occurring within the last 10 years: No If all of the above answers are "NO", then may proceed with Cephalosporin use.      Medication List    TAKE these medications   lidocaine 5 % Commonly known as:  LIDODERM Place 1 patch onto the skin daily. Remove & Discard patch within 12 hours or as directed by MD   Milk Thistle 150 MG Caps Take 450 mg by mouth daily.   multivitamin with minerals Tabs tablet Take 1 tablet by mouth daily.   omega-3 acid ethyl esters 1 g capsule Commonly known as:  LOVAZA Take 1 g by mouth daily.   oxyCODONE 5 MG immediate release tablet Commonly known as:  Oxy IR/ROXICODONE Take 1 tablet (5 mg total) by mouth every 4 (four) hours as needed for up to 5 days for severe pain.   vitamin B-12 100 MCG tablet Commonly known as:  CYANOCOBALAMIN Take 100 mcg by mouth daily.       Disposition and follow-up:   Meredith Cohen was discharged from [facility] in [place] condition.  At the hospital follow up visit please address:  1.  Calcified pancreatic tail mass s/p EGD + EUS + FNA: Still unclear etiology. Awaiting cytology results. Has GI follow up on 10/3 @2 :15PM or sooner but Dr. Dulce Sellar will contact her.       Discharged with 5 day course of OxyIR --> Please assess pain    Given [facility] card instructions.   2.  Labs / imaging needed at time of follow-up: None   3.  Pending labs/ test needing follow-up: AFP, CA 19-9, CEA Chromagen A,lipase, pancreatic mass biopsy.  Follow-up Appointments: [facility] 9/23 @ 9:45AM  Gastroenterology 12/05/2017 @ 2:15PM  Hospital Course by problem list: 1. Meredith Cohen is a 47year old woman with historyof acute hepatic failure and acute renal failure secondary to portal vein thrombosis in 2016, andalcohol abuse  who presented to [facility], ED on 11/14/2017 with a 3-day complaint of back pain and abdominal pain that radiated anteriorly to her epigastrium. MRI abdomen and 3D revealed central calcification at the tail of the pancreas.  Currently working differential diagnosis includes chronic pancreatitis vs neuroendocrine tumor vs adenocarcinoma.  Gastroenterology and general surgery was consulted.  She underwent EGD + EUS with FNA and biopsy 11/19/2017.  Her pain was controlled with OxyIR and Tylenol.  General surgery wanted to follow-up on tumor markers and pathology results before proceeding with surgical intervention.   Discharge Vitals:   BP (!) 98/51 (BP Location: Right Arm)   Pulse 73   Temp 98.7 F (37.1 C) (Oral)   Resp 18   Ht 5\' 6"  (1.676 m)   Wt 67.5 kg   LMP 10/15/2017 (Approximate)   SpO2 95%   BMI 24.02 kg/m   Pertinent Labs, Studies, and Procedures:  MRI abdomen  IMPRESSION:  1. Arterial phase hypoenhancing mass in the tail the pancreas with coarse central calcification and surrounding peripancreatic inflammatory stranding. The mass appears to have some accentuation of enhancement on later phase images. There are adjacent vessels of the splenic hilum. While the coarse internal calcification would be an unusual feature for pancreatic adenocarcinoma, pancreatic neuroendocrine cancer can notably have coarse irregular central calcification similar to this lesion and is the top differential diagnostic consideration.  The inflammatory findings directly surrounding this mass also raise the possibility of focal acute on chronic pancreatitis as a potential cause. Metastatic disease to the pancreas can sometimes be calcified. Other types of calcified pancreatic lesions usually have a different pattern, such as peripheral or linear calcifications rather than the coarse central calcifications seen on this case. There are some closely associated vessels of the splenic hilum, and some form of calcified vascular malformation in close approximation to the pancreatic tail is likewise a differential diagnostic consideration, but seems less likely in light of the early hypoenhancement. No metastatic lesions are evident in the liver. Tissue diagnosis is likely warranted, or alternatively if the possibility of acute on chronic focal pancreatitis is suspected clinically, surveillance imaging might be considered. 2. 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 No biliary dilatation. 3.  Aortic Atherosclerosis (ICD10-I70.0).   MR 3D Recon IMPRESSION: 1. Arterial phase hypoenhancing mass in the tail the pancreas with coarse central calcification and surrounding peripancreatic inflammatory stranding. The mass appears to have some accentuation of enhancement on later phase images. There are adjacent vessels of the splenic hilum. While the coarse internal calcification would be an unusual feature for pancreatic adenocarcinoma, pancreatic neuroendocrine cancer can notably have coarse irregular central calcification similar to this lesion and is the top differential diagnostic consideration. The inflammatory findings directly surrounding this mass also raise the possibility of focal acute on chronic pancreatitis as a potential cause. Metastatic disease to the pancreas can sometimes be calcified. Other types of calcified pancreatic lesions usually have a different pattern, such as peripheral or linear calcifications  rather than the coarse central calcifications seen on this case. There are some closely associated vessels of the splenic hilum, and some form of calcified vascular malformation in close approximation to the pancreatic tail is likewise a differential diagnostic consideration, but seems less likely in light of the early hypoenhancement. No metastatic lesions are evident in the liver. Tissue diagnosis is likely warranted, or alternatively if the possibility of acute on chronic focal pancreatitis is suspected clinically, surveillance imaging might be considered. 2. 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 No biliary dilatation. 3.  Aortic Atherosclerosis (ICD10-I70.0).  CT Renal Stone IMPRESSION: 1. No nephroureterolithiasis. No hydronephrosis. No acute process within the abdomen or pelvis. 2. Indeterminate mass with central calcification within the pancreatic tail. This needs dedicated evaluation with pancreatic protocol CT or MRI. Small amount of surrounding fat stranding. Acute superimposed process is not excluded. 3. Lobular contour of the liver which is nonspecific, cirrhotic etiology not excluded. Recommend attention for the possibility of hepatic mass on contrast-enhanced CT or MRI.   Discharge Instructions: Discharge Instructions    Call MD for:  difficulty breathing, headache or visual disturbances   Complete by:  As directed    Call MD for:  extreme fatigue   Complete by:  As directed    Call MD for:  persistant nausea and vomiting   Complete by:  As directed    Call MD for:  severe uncontrolled pain   Complete by:  As directed  Diet - low sodium heart healthy   Complete by:  As directed    Discharge instructions   Complete by:  As directed    Ms. Kennebrew,   It was a pleasure taking care of you here at the hospital. You were admitted because of abdominal pain and we found a mass in your pancreas. We did a biopsy of the mass and we are currently waiting for the  results and also did some more blood work.   I have made an appointment for you with the Internal Medicine [facility] on 11/25/2017 at 9:45AM You also have an appointment with the GI doctor on 12/05/2017 at 2:15PM  ~Take care Dr. Dortha Schwalbe   Increase activity slowly   Complete by:  As directed       Signed: Yvette Rack, MD 11/20/2017, 4:38 PM   Pager: [phone] IMTS PGY-1

## 2017-11-19 NOTE — Progress Notes (Addendum)
   Subjective:   Overnight: No acute events reported   Meredith Cohen was examined and states currently endorsing back pain same as her presenting complaint. She is due for her pain medication shortly. Understands she will go for her endoscopic procedure today. She is able to tolerate meals yesterday without nausea & vomiting. Also, she no longer endorsing heartburn.    Objective:  Vital signs in last 24 hours: Vitals:   11/18/17 1508 11/19/17 0446 11/19/17 0837 11/19/17 0944  BP: (!) 103/56 (!) 107/48 116/60 102/62  Pulse: 70  (!) 30 81  Resp:    12  Temp: 98.5 F (36.9 C) 98.4 F (36.9 C) 98.1 F (36.7 C) 98.5 F (36.9 C)  TempSrc: Oral Oral Oral Oral  SpO2: 95% 98% 99% 100%  Weight:      Height:       Const: In NAD, pleasantly lying in bed  CV: RRR, no MGR Resp: CTABL, no wheezes, crackles, rhonchi  Abd: BS+, non distended, NTTP   Assessment/Plan:  Principal Problem:   Pancreatic mass Active Problems:   Abdominal pain  Meredith Cohen is a [age]year old woman with historyof acute hepatic failure and acute renal failure secondary to portal vein thrombosis in 2016, andalcohol abuse here for management ofcalcified pancreatic tail mass s/p EGD + EUS with FNA and biopsy 11/19/2017  Calcified pancreatic tail mass:Only endorses back pain similar to her previous sciatic pain. States the current pain regimen is helping her. She is well aware of her procedure today.   - Appreciate GI recommendations  *Will discuss with Dr. Dulce Sellarutlaw regarding next steps after procedure  *Follow up CEA, CA 19-9, Chromogranin A  *Follow up biopsy results   *Surgical consult for distal pancreatectomy  -Clear liquid diet - Pain regimen: OxyIR 5mg  prn, Tylenol - Zofran prn nausea and vomiting      FEN: Clear liquid diet, replace electrolytes as needed DVT JYN:WGNFppx:SubQ Lovenox Code status:FULL code  Anticipate discharge pending GI recommendations.   Yvette RackAgyei, Donovan Gatchel K, MD 11/19/2017, 11:25 AM Pager:  [phone] IMTS PGY-1

## 2017-11-19 NOTE — Interval H&P Note (Signed)
History and Physical Interval Note:  11/19/2017 10:13 AM  Meredith Cohen  has presented today for surgery, with the diagnosis of pancreatic mass  The various methods of treatment have been discussed with the patient and family. After consideration of risks, benefits and other options for treatment, the patient has consented to  Procedure(s): UPPER ENDOSCOPIC ULTRASOUND (EUS) LINEAR (Left) ESOPHAGOGASTRODUODENOSCOPY (EGD) WITH PROPOFOL (N/A) as a surgical intervention .  The patient's history has been reviewed, patient examined, no change in status, stable for surgery.  I have reviewed the patient's chart and labs.  Questions were answered to the patient's satisfaction.     Freddy JakschUTLAW,Venda Dice M

## 2017-11-19 NOTE — Anesthesia Postprocedure Evaluation (Signed)
Anesthesia Post Note  Patient: Meredith Cohen  Procedure(s) Performed: UPPER ENDOSCOPIC ULTRASOUND (EUS) LINEAR (Left ) ESOPHAGOGASTRODUODENOSCOPY (EGD) WITH PROPOFOL (N/A ) FINE NEEDLE ASPIRATION (FNA) LINEAR     Patient location during evaluation: PACU Anesthesia Type: MAC Level of consciousness: awake Pain management: pain level controlled Vital Signs Assessment: post-procedure vital signs reviewed and stable Respiratory status: spontaneous breathing Cardiovascular status: stable Postop Assessment: no apparent nausea or vomiting Anesthetic complications: no    Last Vitals:  Vitals:   11/19/17 1129 11/19/17 1140  BP: (!) 104/49 91/66  Pulse: 85 79  Resp: 16 16  Temp: 36.6 C   SpO2: 100% 100%    Last Pain:  Vitals:   11/19/17 1140  TempSrc:   PainSc: 0-No pain                 Aubriauna Riner

## 2017-11-19 NOTE — Anesthesia Preprocedure Evaluation (Signed)
Anesthesia Evaluation  Patient identified by MRN, date of birth, ID band Patient awake    Airway Mallampati: II  TM Distance: >3 FB     Dental   Pulmonary Current Smoker,    breath sounds clear to auscultation       Cardiovascular  Rhythm:Regular Rate:Normal     Neuro/Psych    GI/Hepatic History noted. CG   Endo/Other    Renal/GU      Musculoskeletal   Abdominal   Peds  Hematology   Anesthesia Other Findings   Reproductive/Obstetrics                             Anesthesia Physical Anesthesia Plan  ASA: III  Anesthesia Plan: MAC   Post-op Pain Management:    Induction: Intravenous  PONV Risk Score and Plan: Treatment may vary due to age or medical condition, Ondansetron, Dexamethasone and Midazolam  Airway Management Planned: Simple Face Mask and Nasal Cannula  Additional Equipment:   Intra-op Plan:   Post-operative Plan:   Informed Consent: I have reviewed the patients History and Physical, chart, labs and discussed the procedure including the risks, benefits and alternatives for the proposed anesthesia with the patient or authorized representative who has indicated his/her understanding and acceptance.   Dental advisory given  Plan Discussed with: CRNA and Anesthesiologist  Anesthesia Plan Comments:         Anesthesia Quick Evaluation

## 2017-11-19 NOTE — Op Note (Signed)
[facility] Patient Name: Meredith Cohen Procedure Date : 11/19/2017 MRN: [id number] Attending MD: Willis Modena , MD Date of Birth: 1970-04-29 CSN: [id number] Age: 47 Admit Type: Inpatient Procedure:                Upper EUS Indications:              Suspected mass in pancreas on CT scan, Epigastric                            abdominal pain Providers:                Willis Modena, MD, Dwain Sarna, RN, Margo Aye, Technician Referring MD:             [facility]. Medicines:                Monitored Anesthesia Care, ciprofloxacin 400 mg IV Complications:            No immediate complications. Estimated Blood Loss:     Estimated blood loss: none. Procedure:                Pre-Anesthesia Assessment:                           - Prior to the procedure, a History and Physical                            was performed, and patient medications and                            allergies were reviewed. The patient's tolerance of                            previous anesthesia was also reviewed. The risks                            and benefits of the procedure and the sedation                            options and risks were discussed with the patient.                            All questions were answered, and informed consent                            was obtained. Prior Anticoagulants: The patient has                            taken no previous anticoagulant or antiplatelet                            agents. ASA Grade Assessment: III - A patient with  severe systemic disease. After reviewing the risks                            and benefits, the patient was deemed in                            satisfactory condition to undergo the procedure.                           After obtaining informed consent, the endoscope was                            passed under direct vision. Throughout the                             procedure, the patient's blood pressure, pulse, and                            oxygen saturations were monitored continuously. The                            [id number] ([id number]) Olympus Linear EUS was                            introduced through the mouth, and advanced to the                            second part of duodenum. The [id number] ([id number])                            Olympus adult EGD was introduced through the mouth,                            and advanced to the antrum of the stomach. The                            upper EUS was accomplished without difficulty. The                            patient tolerated the procedure well. Scope In: Scope Out: Findings:      ENDOSCOPIC FINDING: :      The examined esophagus was normal.      The entire examined stomach was normal.      The duodenal bulb, first portion of the duodenum and second portion of       the duodenum were normal.      ENDOSONOGRAPHIC FINDING: :      There was diffuse abnormal echotexture in the left lobe of the liver.       This was characterized by a hyperechoic appearance.      The pancreatic duct had an irregularly contoured endosonographic       appearance in the genu of the pancreas, body of the pancreas and tail of       the pancreas.      Endosonographic imaging of the pancreas showed sonographic changes  indicative of moderate chronic pancreatitis in the genu of the pancreas,       pancreatic body and pancreatic tail. The parenchyma had hyperechoic       strands, hyperechoic foci and hypoechoic foci. The pancreatic duct       measured up to 3 mm in diameter.      No lymphadenopathy seen.      An irregular mass was identified in the pancreatic tail. The mass was       hypoechoic, heterogenous, calcified and mixed solid and cystic. The mass       measured 17 mm by 22 mm in maximal cross-sectional diameter. The       endosonographic borders were poorly-defined. Fine needle aspiration for        cytology was performed. Color Doppler imaging was utilized prior to       needle puncture to confirm a lack of significant vascular structures       within the needle path; could not biopsy in the "center" of the lesion       because of two large overlying vessels; biopsies were done around the       margin of the lesion Three passes were made with the 25 gauge needle       using a transgastric approach. A stylet was used. A preliminary       cytologic examination was not performed. Final cytology results are       pending. Impression:               - Normal esophagus.                           - Normal stomach.                           - Normal duodenal bulb, first portion of the                            duodenum and second portion of the duodenum.                           - There was diffuse abnormal echotexture in the                            left lobe of the liver. This was characterized by a                            hyperechoic appearance.                           - The pancreatic duct had an irregularly contoured                            endosonographic appearance in the genu of the                            pancreas, body of the pancreas and tail of the                            pancreas.                           -  Endosonographic imaging of the pancreas showed                            sonographic changes of moderate chronic                            pancreatitis.                           - A mass was identified in the pancreatic tail.                            Fine needle aspiration performed (couldn't biopsy                            the bulk of the lesion due to overlying                            vasculature, as described above). Could be chronic                            benign cystic lesion versus necrotic                            neuroendocrine/adenocarcinoma. May be sequelae of                            chronic pancreatitis. Moderate Sedation:       NONE Recommendation:           - Return patient to hospital ward for ongoing care.                           - Clear liquid diet today.                           - Await cytology results.                           - Check CA 19-9 at appointment to be scheduled.                           [facility] will follow.                           - Regardless of biopsy results, given patient's                            young age and her symptoms, patient likely would                            benefit from surgical consultation for                            consideration of distal pancreatectomy. Procedure Code(s):        --- Professional ---  16109, Esophagogastroduodenoscopy, flexible,                            transoral; with transendoscopic ultrasound-guided                            intramural or transmural fine needle                            aspiration/biopsy(s), (includes endoscopic                            ultrasound examination limited to the esophagus,                            stomach or duodenum, and adjacent structures) Diagnosis Code(s):        --- Professional ---                           R93.3, Abnormal findings on diagnostic imaging of                            other parts of digestive tract                           K86.1, Other chronic pancreatitis                           K86.89, Other specified diseases of pancreas                           R10.13, Epigastric pain                           R93.2, Abnormal findings on diagnostic imaging of                            liver and biliary tract CPT copyright 2017 [employer] Association. All rights reserved. The codes documented in this report are preliminary and upon coder review may  be revised to meet current compliance requirements. Willis Modena, MD 11/19/2017 11:36:16 AM This report has been signed electronically. Number of Addenda: 0

## 2017-11-19 NOTE — Progress Notes (Addendum)
1245 MD paged as pt asking for "something stronger for pain." MD called back stating would like pt to try tylenol prior to adding other medications. Will continue to assess patient's needs.   1610 MD paged regarding pt pain regimen. Pt states "I could not tell that tylenol made any difference. The oxy helps a little bit but I feel as if I need something more for this pain in my back and hips."  MD called back, no orders received at this time.

## 2017-11-19 NOTE — Transfer of Care (Signed)
Immediate Anesthesia Transfer of Care Note  Patient: Meredith Cohen  Procedure(s) Performed: UPPER ENDOSCOPIC ULTRASOUND (EUS) LINEAR (Left ) ESOPHAGOGASTRODUODENOSCOPY (EGD) WITH PROPOFOL (N/A ) FINE NEEDLE ASPIRATION (FNA) LINEAR  Patient Location: Endoscopy Unit  Anesthesia Type:MAC  Level of Consciousness: awake, patient cooperative and responds to stimulation  Airway & Oxygen Therapy: Patient Spontanous Breathing and Patient connected to nasal cannula oxygen  Post-op Assessment: Report given to RN, Post -op Vital signs reviewed and stable and Patient moving all extremities X 4  Post vital signs: Reviewed and stable  Last Vitals:  Vitals Value Taken Time  BP 104/49 11/19/2017 11:34 AM  Temp 36.6 C 11/19/2017 11:29 AM  Pulse 79 11/19/2017 11:34 AM  Resp 18 11/19/2017 11:34 AM  SpO2 100 % 11/19/2017 11:34 AM  Vitals shown include unvalidated device data.  Last Pain:  Vitals:   11/19/17 1129  TempSrc: Oral  PainSc: 0-No pain      Patients Stated Pain Goal: 4 (37/54/36 0677)  Complications: No apparent anesthesia complications

## 2017-11-19 NOTE — Anesthesia Postprocedure Evaluation (Signed)
Anesthesia Post Note  Patient: Meredith Cohen  Procedure(s) Performed: UPPER ENDOSCOPIC ULTRASOUND (EUS) LINEAR (Left ) ESOPHAGOGASTRODUODENOSCOPY (EGD) WITH PROPOFOL (N/A ) FINE NEEDLE ASPIRATION (FNA) LINEAR     Patient location during evaluation: PACU Anesthesia Type: MAC Level of consciousness: awake Pain management: pain level controlled Vital Signs Assessment: post-procedure vital signs reviewed and stable Respiratory status: spontaneous breathing Cardiovascular status: stable Postop Assessment: no apparent nausea or vomiting Anesthetic complications: no    Last Vitals:  Vitals:   11/19/17 1129 11/19/17 1140  BP: (!) 104/49 91/66  Pulse: 85 79  Resp: 16 16  Temp: 36.6 C   SpO2: 100% 100%    Last Pain:  Vitals:   11/19/17 1140  TempSrc:   PainSc: 0-No pain                 Makyi Ledo

## 2017-11-20 DIAGNOSIS — E785 Hyperlipidemia, unspecified: Secondary | ICD-10-CM

## 2017-11-20 DIAGNOSIS — F1010 Alcohol abuse, uncomplicated: Secondary | ICD-10-CM

## 2017-11-20 LAB — COMPREHENSIVE METABOLIC PANEL
ALK PHOS: 55 U/L (ref 38–126)
ALT: 19 U/L (ref 0–44)
AST: 27 U/L (ref 15–41)
Albumin: 3.4 g/dL — ABNORMAL LOW (ref 3.5–5.0)
Anion gap: 10 (ref 5–15)
BILIRUBIN TOTAL: 0.6 mg/dL (ref 0.3–1.2)
BUN: 8 mg/dL (ref 6–20)
CALCIUM: 9.2 mg/dL (ref 8.9–10.3)
CO2: 27 mmol/L (ref 22–32)
CREATININE: 0.76 mg/dL (ref 0.44–1.00)
Chloride: 102 mmol/L (ref 98–111)
Glucose, Bld: 84 mg/dL (ref 70–99)
Potassium: 3.9 mmol/L (ref 3.5–5.1)
Sodium: 139 mmol/L (ref 135–145)
Total Protein: 6.4 g/dL — ABNORMAL LOW (ref 6.5–8.1)

## 2017-11-20 LAB — CBC
HCT: 39.8 % (ref 36.0–46.0)
HEMOGLOBIN: 13.4 g/dL (ref 12.0–15.0)
MCH: 32.7 pg (ref 26.0–34.0)
MCHC: 33.7 g/dL (ref 30.0–36.0)
MCV: 97.1 fL (ref 78.0–100.0)
Platelets: 225 10*3/uL (ref 150–400)
RBC: 4.1 MIL/uL (ref 3.87–5.11)
RDW: 12.2 % (ref 11.5–15.5)
WBC: 6.8 10*3/uL (ref 4.0–10.5)

## 2017-11-20 LAB — LIPASE, BLOOD: LIPASE: 22 U/L (ref 11–51)

## 2017-11-20 LAB — LIPID PANEL
CHOLESTEROL: 175 mg/dL (ref 0–200)
HDL: 39 mg/dL — ABNORMAL LOW (ref >40)
LDL Cholesterol: 110 mg/dL — ABNORMAL HIGH (ref 0–99)
Total CHOL/HDL Ratio: 4.5 RATIO
Triglycerides: 130 mg/dL (ref <150)
VLDL: 26 mg/dL (ref 0–40)

## 2017-11-20 LAB — GLUCOSE, CAPILLARY: GLUCOSE-CAPILLARY: 88 mg/dL (ref 70–99)

## 2017-11-20 MED ORDER — ACETAMINOPHEN 650 MG RE SUPP: 650.0000 mg | Freq: Four times a day (QID) | RECTAL | Status: DC

## 2017-11-20 MED ORDER — OXYCODONE HCL 5 MG PO TABS: 5.0000 mg | ORAL_TABLET | ORAL | 0 refills | Status: DC | PRN

## 2017-11-20 MED ORDER — LIDOCAINE 5 % EX PTCH: 1.0000 | MEDICATED_PATCH | CUTANEOUS | 0 refills | Status: DC

## 2017-11-20 MED ORDER — ACETAMINOPHEN 325 MG PO TABS
650.0000 mg | ORAL_TABLET | Freq: Four times a day (QID) | ORAL | Status: DC
  Administered 2017-11-20: 650 mg via ORAL
  Filled 2017-11-20: qty 2

## 2017-11-20 NOTE — Progress Notes (Signed)
1 Day Post-Op    ZO:XWRUCC:back and abdominal pain  Subjective: She still complains of pain tolerating soft diet.  Objective: Vital signs in last 24 hours: Temp:  [97.7 F (36.5 C)-98.7 F (37.1 C)] 98.1 F (36.7 C) (09/18 0742) Pulse Rate:  [56-94] 82 (09/18 0742) Resp:  [16-18] 18 (09/18 0742) BP: (82-104)/(37-66) 95/58 (09/18 0742) SpO2:  [95 %-100 %] 95 % (09/18 0742) Last BM Date: 11/19/17 480 PO Urine x 5 BM x 1 Afebrile, BP borderline low Labs all look good; markers are pending Pathology pending   Intake/Output from previous day: 09/17 0701 - 09/18 0700 In: 880 [P.O.:480; I.V.:400] Out: 0  Intake/Output this shift: No intake/output data recorded.  General appearance: alert, cooperative, no distress and Teaching service just went over her, still complains of back pain.  Lab Results:  Recent Labs    11/18/17 0738 11/20/17 0335  WBC 6.1 6.8  HGB 13.5 13.4  HCT 41.0 39.8  PLT 204 225    BMET Recent Labs    11/18/17 0738 11/20/17 0335  NA 140 139  K 4.4 3.9  CL 104 102  CO2 30 27  GLUCOSE 102* 84  BUN 8 8  CREATININE 0.77 0.76  CALCIUM 9.3 9.2   PT/INR Recent Labs    11/18/17 0738  LABPROT 13.2  INR 1.01    Recent Labs  Lab 11/14/17 1008 11/14/17 2356 11/18/17 0738 11/20/17 0335  AST 26 20 21 27   ALT 16 14 14 19   ALKPHOS 78 61 60 55  BILITOT 0.7 0.3 0.2* 0.6  PROT 7.2 5.7* 6.5 6.4*  ALBUMIN 3.9 3.3* 3.5 3.4*     Lipase     Component Value Date/Time   LIPASE 22 11/20/2017 0335     Medications: . acetaminophen  650 mg Oral Q6H   Or  . acetaminophen  650 mg Rectal Q6H  . enoxaparin (LOVENOX) injection  40 mg Subcutaneous Daily  . lidocaine  1 patch Transdermal Q24H  . ramelteon  8 mg Oral QHS  . sodium chloride flush  3 mL Intravenous Q12H   . 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 History of alcohol use - Tobacco use -   FEN:  Soft diet ID:  None DVT:  Lovenox Follow up:  TBD   Plan:  Determine if this is pancreatitis vs a mass.  Await path and markers.  No acute needs if this turns out to be a tumor we will refer to Dr. Donell BeersByerly our Oncology Surgeon.  LOS: 6 days    Roopa Graver 11/20/2017 [phone]

## 2017-11-20 NOTE — Progress Notes (Signed)
Subjective: Less pain.  Objective: Vital signs in last 24 hours: Temp:  [97.8 F (36.6 C)-98.7 F (37.1 C)] 97.8 F (36.6 C) (09/18 1220) Pulse Rate:  [44-82] 44 (09/18 1220) Resp:  [18] 18 (09/18 1220) BP: (82-104)/(37-64) 94/60 (09/18 1220) SpO2:  [95 %-100 %] 100 % (09/18 1220) Weight change:  Last BM Date: 11/19/17  PE: GEN:  NAD  Lab Results: CBC    Component Value Date/Time   WBC 6.8 11/20/2017 0335   RBC 4.10 11/20/2017 0335   HGB 13.4 11/20/2017 0335   HCT 39.8 11/20/2017 0335   PLT 225 11/20/2017 0335   MCV 97.1 11/20/2017 0335   MCH 32.7 11/20/2017 0335   MCHC 33.7 11/20/2017 0335   RDW 12.2 11/20/2017 0335   CMP     Component Value Date/Time   NA 139 11/20/2017 0335   K 3.9 11/20/2017 0335   CL 102 11/20/2017 0335   CO2 27 11/20/2017 0335   GLUCOSE 84 11/20/2017 0335   BUN 8 11/20/2017 0335   CREATININE 0.76 11/20/2017 0335   CALCIUM 9.2 11/20/2017 0335   PROT 6.4 (L) 11/20/2017 0335   ALBUMIN 3.4 (L) 11/20/2017 0335   AST 27 11/20/2017 0335   ALT 19 11/20/2017 0335   ALKPHOS 55 11/20/2017 0335   BILITOT 0.6 11/20/2017 0335   GFRNONAA >60 11/20/2017 0335   GFRAA >60 11/20/2017 0335   Assessment:  1. Alcohol abuse, abstinent now. 2. 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 3. Abdominal pain. 4. Abnormal imaging: Pancreatitis, pancreatic tail lesion.  Plan:  1.  Oral analgesic regimen, with goal of manageable (but not necessarily resolved) pain. 2.  Awaiting FNA and tumor markers. 3.  Advance diet as tolerated. 4.  Ultimate outpatient evaluation possible distal pancreatectomy. 5.  Hopefully home tomorrow. 6.  [facility] will follow.   Meredith Cohen,Meredith Cohen 11/20/2017, 1:49 PM   Cell [phone] If no answer or after 5 PM call [phone]

## 2017-11-20 NOTE — Progress Notes (Signed)
Patient d/c instructions given to patient including prescription, husband at bedside. IV removed.  Patient taken to car via wheelchair with all belongings.

## 2017-11-20 NOTE — Progress Notes (Signed)
   Subjective:   [name] was examined at bedside and reports that she is doing well. She endorses back pain, rates at  5/10 that wraps around to the front from back but she was due for her pain medication. She feels like current pain regimen is working for her. She is tolerating soft diet without issues. I explained to patient about tumor marker labs and waiting for the results. Surgery was present and explained to patient about waiting for the lab results before proceeding with intervention.  Objective:  Vital signs in last 24 hours: Vitals:   11/19/17 2100 11/19/17 2338 11/20/17 0345 11/20/17 0456  BP: (!) 93/58 (!) 104/51 (!) 89/57 (!) 89/57  Pulse:      Resp:      Temp:  98.7 F (37.1 C) 98 F (36.7 C)   TempSrc:  Oral Oral   SpO2:      Weight:      Height:       Const: In NAD, lying comfortably in bed CV: RRR, no MGR Resp: CTABL, no wheezes, crackles, rhonchi  Abd: BS+, mildly TTP at Left mid abdomen  Assessment/Plan:  Principal Problem:   Pancreatic mass Active Problems:   Abdominal pain  [name] is a[age]year old woman with historyof acute hepatic failure and acute renal failure secondary to portal vein thrombosis in 2016, andalcohol abusehere for management ofcalcified pancreatic tail mass s/p EGD + EUS with FNA and biopsy 11/19/2017  Calcified pancreatic tail mass:I adjusted her pain medications yesterday and also provided lidocaine patches and that seems to have appropriately helped her abdominal and back pain. She is tolerating soft diet with no difficulties and denies nausea or vomiting. - Appreciate GI and General surgery recommendations             *Follow up CEA, CA 19-9, Chromogranin A, Lipase, AFP             *Follow up biopsy results   * No urgent need for surgical intervention  - Pain regimen: OxyIR 5mg  prn, Tylenol, Lidocaine patch  - Zofran prn nausea and vomiting  Hyperlipidemia: LDL of 110. ASCVD score of 1.6%. Will not initiate statin  therapy.     FEN: Soft diet, replace electrolytes as needed DVT ZOX:WRUEppx:SubQ Lovenox Code status:FULL code  Anticipate discharge pending GI and general surgery recommendations.    Yvette RackAgyei, Tyrian Peart K, MD 11/20/2017, 6:55 AM Pager: [phone] [facility] PGY-1

## 2017-11-21 LAB — CANCER ANTIGEN 19-9: CA 19-9: 23 U/mL (ref 0–35)

## 2017-11-21 LAB — CEA: CEA: 5.8 ng/mL — ABNORMAL HIGH (ref 0.0–4.7)

## 2017-11-21 LAB — AFP TUMOR MARKER: AFP, SERUM, TUMOR MARKER: 1.2 ng/mL (ref 0.0–8.3)

## 2017-11-22 LAB — CHROMOGRANIN A: Chromogranin A: 1 nmol/L (ref 0–5)

## 2017-11-25 ENCOUNTER — Other Ambulatory Visit: Payer: Self-pay

## 2017-11-25 ENCOUNTER — Ambulatory Visit: Payer: Self-pay | Admitting: Internal Medicine

## 2017-11-25 DIAGNOSIS — Z720 Tobacco use: Secondary | ICD-10-CM

## 2017-11-25 DIAGNOSIS — K8689 Other specified diseases of pancreas: Secondary | ICD-10-CM

## 2017-11-25 DIAGNOSIS — Z79891 Long term (current) use of opiate analgesic: Secondary | ICD-10-CM

## 2017-11-25 DIAGNOSIS — R110 Nausea: Secondary | ICD-10-CM

## 2017-11-25 DIAGNOSIS — R0781 Pleurodynia: Secondary | ICD-10-CM

## 2017-11-25 DIAGNOSIS — R109 Unspecified abdominal pain: Secondary | ICD-10-CM

## 2017-11-25 DIAGNOSIS — M549 Dorsalgia, unspecified: Secondary | ICD-10-CM

## 2017-11-25 MED ORDER — OXYCODONE-ACETAMINOPHEN 10-325 MG PO TABS: 1.0000 | ORAL_TABLET | ORAL | 0 refills | Status: AC | PRN

## 2017-11-25 NOTE — Progress Notes (Signed)
   CC: Hospital follow-up  HPI:  [name] is a 47 y.o. with past medical history as listed below, came into the clinic today for hospital follow-up, pain evaluation, lab and biopsy results review.  Please see encounter base assessment and plan for further details  Past Medical History:  Diagnosis Date  . History of alcohol use disorder   . Portal vein thrombosis    Social Hx: Current smoker 5 cigarettes/day No alcohol use currently (has history of alcohol abuse in the past) No drug use  Family Hx: Unclear (patient is adopted)  Review of Systems:  Review of Systems  Constitutional: Negative for chills, fever and weight loss.  Cardiovascular: Negative for chest pain and leg swelling.  Gastrointestinal: Positive for abdominal pain and nausea. Negative for blood in stool, constipation, diarrhea and vomiting.  Musculoskeletal: Positive for back pain.  Skin: Negative for rash.  Neurological: Negative for sensory change and focal weakness.    Physical Exam:  Vitals:   11/25/17 1012  BP: 105/60  Pulse: 83  Temp: 98.2 F (36.8 C)  TempSrc: Oral  SpO2: 97%  Weight: 146 lb 12.8 oz (66.6 kg)   Physical Exam  Constitutional: She is oriented to person, place, and time. She appears well-developed and well-nourished.  Eyes: EOM are normal.  Cardiovascular: Normal rate, regular rhythm and normal heart sounds.  No murmur heard. Pulmonary/Chest: Effort normal and breath sounds normal. No respiratory distress. She has no rales.  Abdominal: Soft. Bowel sounds are normal. There is tenderness. There is no guarding.  Musculoskeletal: She exhibits no edema.  Neurological: She is alert and oriented to person, place, and time.    Assessment & Plan:   See Encounters Tab for problem based charting.  Patient seen with Dr. Heide Cohen

## 2017-11-25 NOTE — Assessment & Plan Note (Addendum)
Patient with newly diagnosed mass with calcification at tail of pancreas in last hospitalization due to severe bilateral back pain at wraps around lower ribs and abdomen.  Came in today for hospital follow-up, pain evaluation, lab results, biopsy results as well as Patient reports constant pain and her back that wraps around her lower ribs and abdomen. No change in nature of pain comparing to when she was hospitalized. No vomiting, No diarrhea, no constipation. No fever.  Exam: Vitals are stable . Abdomen is soft and exam. Nonspecific general pain in her back and abdomen in palpation and with movement.  Lab results: CA 19-9, lipase, AFP, chromogranin A came back normal.                     Slight elevation in CEA (5.8, normal range is 0-4 0.7)  Biopsy result: The atypical cells are not definitively diagnostic of neoplasia per pathologist.   Differential diagnosis has been pain due to chronic pancreatitis versus neuroendocrine tumor versus adenocarcinoma.  Since lab and pathology have not shown specific malignant etiology, diagnosis still seems to uncertain. Patient has an upcoming GI appointment, surgery may be planned. Our plan is to control her pain at this point until she sees GI in 10 days, informed her about questions. Also about red flags and concerning symptoms to come to ED our clinic.  -Follow-up with GI as scheduled for 12/05/2017 and surgery per GI recommendation

## 2017-11-25 NOTE — Patient Instructions (Signed)
It was our pleasure taking care of you in our clinic today. You were seen for hospital follow up, pain evaluation and lab results for diffused back pain and pancrease mass/inflammation.   As we talked and because current dose of Oxy IR did not ease up your pain, I prescribe you Percocet 10-325 to take 1 tab every 4 hours as needed. This medication may make you drowsy, so do not drive, and avoid the activitis that needs attention, or may cause you fall.   As we talked, please do not take Tylenol when you are taking Percocet, as it may be harmful for your liver.  Avoid alcohol as it may increase inflammation in your pancreas.  Your blood test showed slight elevation in CEA and otherwise normal. It can be due to inflammation in pancreas or other causes. As GI and surgery recommended, more tests or surgery may be needed for actual diagnose. Please follow up with GI as scheduled for you on 12/05/2017.  Please let us know if pain is not controled with Percocet or if you develop new symptoms such as difficulty eating, vomiting, fever or any new sever symptoms. Also onsider going emergency room if having worsening.  Thank you and I hope you feel better soon.  Dr. Maryla MorrowMasoudi

## 2017-11-25 NOTE — Assessment & Plan Note (Addendum)
Patient was seen today for pain evaluation. Has been On Oxy IR 5 mg after DC from hospital last week due to sever bilateral back pain, that wrap to her ribs and abdomen. She found to have  pancreatic calcification vs mass. GI and surgery consulted when she was at hospital and she has an appointment for 12/05/2017. She mentions that she is in sever pain, 9/10. Taking Oxy IR g 4h + Ibuprofen did not help it. Denies any fever, chills, vomiting, diarrhea or constipation.  -Percocet 10-325 mg q 4h PRN for 10 days (Till her appointment with GI) -Recommended to contact us if her pain was not controled and come back to clinic or ED if having vomiting, problem with eating, new symptoms or red flags for acute abdomen as explained to patient. -follow GI appointment and surgery recommendation

## 2017-11-27 NOTE — Progress Notes (Signed)
Internal Medicine Clinic Attending  I saw and evaluated the patient.  I personally confirmed the key portions of the history and exam documented by Dr. Masoudi and I reviewed pertinent patient test results.  The assessment, diagnosis, and plan were formulated together and I agree with the documentation in the resident's note. 

## 2017-12-08 ENCOUNTER — Other Ambulatory Visit: Payer: Self-pay

## 2017-12-08 ENCOUNTER — Encounter (HOSPITAL_COMMUNITY): Payer: Self-pay | Admitting: *Deleted

## 2017-12-08 ENCOUNTER — Emergency Department (HOSPITAL_COMMUNITY)
Admission: EM | Admit: 2017-12-08 | Discharge: 2017-12-08 | Disposition: A | Discharge status: Home / Self Care | Payer: Self-pay | Attending: Emergency Medicine | Admitting: Emergency Medicine

## 2017-12-08 DIAGNOSIS — Z79899 Other long term (current) drug therapy: Secondary | ICD-10-CM | POA: Insufficient documentation

## 2017-12-08 DIAGNOSIS — F17210 Nicotine dependence, cigarettes, uncomplicated: Secondary | ICD-10-CM | POA: Insufficient documentation

## 2017-12-08 DIAGNOSIS — R109 Unspecified abdominal pain: Secondary | ICD-10-CM | POA: Insufficient documentation

## 2017-12-08 DIAGNOSIS — R112 Nausea with vomiting, unspecified: Secondary | ICD-10-CM | POA: Insufficient documentation

## 2017-12-08 LAB — COMPREHENSIVE METABOLIC PANEL
ALBUMIN: 3.8 g/dL (ref 3.5–5.0)
ALT: 12 U/L (ref 0–44)
AST: 21 U/L (ref 15–41)
Alkaline Phosphatase: 67 U/L (ref 38–126)
Anion gap: 10 (ref 5–15)
BUN: 7 mg/dL (ref 6–20)
CHLORIDE: 97 mmol/L — AB (ref 98–111)
CO2: 28 mmol/L (ref 22–32)
Calcium: 9.2 mg/dL (ref 8.9–10.3)
Creatinine, Ser: 0.8 mg/dL (ref 0.44–1.00)
GFR calc non Af Amer: >60 mL/min (ref >60)
GLUCOSE: 99 mg/dL (ref 70–99)
Potassium: 4.2 mmol/L (ref 3.5–5.1)
SODIUM: 135 mmol/L (ref 135–145)
Total Bilirubin: 1.1 mg/dL (ref 0.3–1.2)
Total Protein: 6.3 g/dL — ABNORMAL LOW (ref 6.5–8.1)

## 2017-12-08 LAB — URINALYSIS, ROUTINE W REFLEX MICROSCOPIC
Bilirubin Urine: NEGATIVE
Glucose, UA: NEGATIVE mg/dL
Hgb urine dipstick: NEGATIVE
Ketones, ur: 20 mg/dL — AB
Nitrite: NEGATIVE
PROTEIN: NEGATIVE mg/dL
Specific Gravity, Urine: 1.006 (ref 1.005–1.030)
pH: 8 (ref 5.0–8.0)

## 2017-12-08 LAB — CBC
HCT: 42.4 % (ref 36.0–46.0)
HEMOGLOBIN: 14.4 g/dL (ref 12.0–15.0)
MCH: 33.2 pg (ref 26.0–34.0)
MCHC: 34 g/dL (ref 30.0–36.0)
MCV: 97.7 fL (ref 78.0–100.0)
Platelets: 221 10*3/uL (ref 150–400)
RBC: 4.34 MIL/uL (ref 3.87–5.11)
RDW: 12 % (ref 11.5–15.5)
WBC: 10.4 10*3/uL (ref 4.0–10.5)

## 2017-12-08 LAB — I-STAT BETA HCG BLOOD, ED (MC, WL, AP ONLY)

## 2017-12-08 LAB — LIPASE, BLOOD: Lipase: 20 U/L (ref 11–51)

## 2017-12-08 MED ORDER — SODIUM CHLORIDE 0.9 % IV BOLUS
1000.0000 mL | Freq: Once | INTRAVENOUS | Status: AC
  Administered 2017-12-08: 1000 mL via INTRAVENOUS

## 2017-12-08 MED ORDER — ONDANSETRON HCL 4 MG/2ML IJ SOLN
4.0000 mg | Freq: Once | INTRAMUSCULAR | Status: AC
  Administered 2017-12-08: 4 mg via INTRAVENOUS
  Filled 2017-12-08: qty 2

## 2017-12-08 MED ORDER — HYDROMORPHONE HCL 1 MG/ML IJ SOLN
1.0000 mg | Freq: Once | INTRAMUSCULAR | Status: AC
  Administered 2017-12-08: 1 mg via INTRAVENOUS
  Filled 2017-12-08: qty 1

## 2017-12-08 MED ORDER — OXYCODONE-ACETAMINOPHEN 5-325 MG PO TABS: 1.0000 | ORAL_TABLET | Freq: Four times a day (QID) | ORAL | 0 refills | Status: DC | PRN

## 2017-12-08 MED ORDER — ONDANSETRON HCL 4 MG PO TABS: 4.0000 mg | ORAL_TABLET | Freq: Four times a day (QID) | ORAL | 0 refills | Status: DC

## 2017-12-08 NOTE — ED Notes (Signed)
Pt unable to provide urine sample at this time. Pt is aware that urine sample is needed. Will try again later.  

## 2017-12-08 NOTE — ED Triage Notes (Signed)
Pt states that she was here 2 weeks ago with extreme back, rib pain and they found a mass on her pancreas.  She was released with pain medication and the pain medication does not work.  She states she has been having abominal pain and vomiting.  They think she may need surgery for chronic/acute pancreatitis.

## 2017-12-08 NOTE — Discharge Instructions (Addendum)
Return for any problem.  Follow-up with your regular providers at the internal medicine clinic as instructed.

## 2017-12-08 NOTE — ED Provider Notes (Signed)
[facility] EMERGENCY DEPARTMENT Provider Note   CSN: [id number] Arrival date & time: 12/08/17  1202     History   Chief Complaint Chief Complaint  Patient presents with  . Abdominal Pain  . Emesis    HPI Meredith Cohen is a 47 y.o. female.  47 year old female with prior medical history as detailed below presents with complaint of acute on chronic abdominal pain.  Patient has a prior history of pancreatitis and pancreatic mass.  Patient presents today with complaint of increasing back pain.  She reports that this pain is consistent with her prior episodes of pancreatitis.  She has been taking Percocet at home without significant improvement in her symptoms.  She denies fever.  She denies chest pain or shortness of breath.  She has upcoming appointments with both surgery and GI for further evaluation of her pancreatic mass.  The history is provided by the patient and medical records.  Abdominal Pain   This is a chronic problem. The current episode started more than 1 week ago. The problem occurs constantly. The problem has not changed since onset.The pain is located in the epigastric region. The quality of the pain is aching. The pain is moderate. Associated symptoms include vomiting. Nothing aggravates the symptoms. Nothing relieves the symptoms. Past workup includes GI consult and CT scan.  Emesis   Associated symptoms include abdominal pain.    Past Medical History:  Diagnosis Date  . History of alcohol use disorder   . Portal vein thrombosis     Patient Active Problem List   Diagnosis Date Noted  . Pancreatic mass   . Bilateral back pain/ lower rib pain/ abdominal pain 11/14/2017    Past Surgical History:  Procedure Laterality Date  . ECTOPIC PREGNANCY SURGERY    . ESOPHAGOGASTRODUODENOSCOPY (EGD) WITH PROPOFOL N/A 11/19/2017   Procedure: ESOPHAGOGASTRODUODENOSCOPY (EGD) WITH PROPOFOL;  Surgeon: Willis Modena, MD;  Location: [facility] ENDOSCOPY;  Service:  Endoscopy;  Laterality: N/A;  . EUS Left 11/19/2017   Procedure: UPPER ENDOSCOPIC ULTRASOUND (EUS) LINEAR;  Surgeon: Willis Modena, MD;  Location: [facility] ENDOSCOPY;  Service: Endoscopy;  Laterality: Left;  . FINE NEEDLE ASPIRATION  11/19/2017   Procedure: FINE NEEDLE ASPIRATION;  Surgeon: Willis Modena, MD;  Location: [facility] ENDOSCOPY;  Service: Endoscopy;;  . Tosillectomy        OB History   None      Home Medications    Prior to Admission medications   Medication Sig Start Date End Date Taking? Authorizing Provider  Milk Thistle 150 MG CAPS Take 450 mg by mouth daily.   Yes [provider]  Multiple Vitamin (MULTIVITAMIN WITH MINERALS) TABS tablet Take 1 tablet by mouth daily.   Yes [provider]  omega-3 acid ethyl esters (LOVAZA) 1 g capsule Take 1 g by mouth daily.   Yes [provider]  oxyCODONE-acetaminophen (PERCOCET) 10-325 MG tablet Take 1 tablet by mouth every 4 (four) hours as needed for pain.   Yes [provider]  vitamin B-12 (CYANOCOBALAMIN) 100 MCG tablet Take 100 mcg by mouth daily.   Yes [provider]  lidocaine (LIDODERM) 5 % Place 1 patch onto the skin daily. Remove & Discard patch within 12 hours or as directed by MD Patient not taking: Reported on 12/08/2017 11/20/17   Yvette Rack, MD  oxyCODONE-acetaminophen (PERCOCET/ROXICET) 5-325 MG tablet Take 1-2 tablets by mouth every 6 (six) hours as needed for severe pain. 12/08/17   Wynetta Fines, MD  Family History Family History  Adopted: Yes    Social History Social History   Tobacco Use  . Smoking status: Current Every Day Smoker    Types: Cigarettes  . Smokeless tobacco: Never Used  . Tobacco comment: 5 cigarettes/day  Substance Use Topics  . Alcohol use: Not Currently  . Drug use: Never     Allergies   Penicillins   Review of Systems Review of Systems  Gastrointestinal: Positive for abdominal pain and vomiting.  All other systems reviewed and  are negative.    Physical Exam Updated Vital Signs BP 111/73   Pulse 69   Temp 97.9 F (36.6 C) (Oral)   Resp 18   LMP 12/01/2017 (Approximate)   SpO2 98%   Physical Exam  Constitutional: She is oriented to person, place, and time. She appears well-developed and well-nourished. No distress.  HENT:  Head: Normocephalic and atraumatic.  Mouth/Throat: Oropharynx is clear and moist.  Eyes: Pupils are equal, round, and reactive to light. Conjunctivae and EOM are normal.  Neck: Normal range of motion. Neck supple.  Cardiovascular: Normal rate, regular rhythm and normal heart sounds.  Pulmonary/Chest: Effort normal and breath sounds normal. No respiratory distress.  Abdominal: Soft. She exhibits no distension. There is no tenderness.  Musculoskeletal: Normal range of motion. She exhibits no edema or deformity.  Neurological: She is alert and oriented to person, place, and time.  Skin: Skin is warm and dry.  Psychiatric: She has a normal mood and affect.  Nursing note and vitals reviewed.    ED Treatments / Results  Labs (all labs ordered are listed, but only abnormal results are displayed) Labs Reviewed  COMPREHENSIVE METABOLIC PANEL - Abnormal; Notable for the following components:      Result Value   Chloride 97 (*)    Total Protein 6.3 (*)    All other components within normal limits  URINALYSIS, ROUTINE W REFLEX MICROSCOPIC - Abnormal; Notable for the following components:   APPearance HAZY (*)    Ketones, ur 20 (*)    Leukocytes, UA TRACE (*)    Bacteria, UA RARE (*)    All other components within normal limits  LIPASE, BLOOD  CBC  I-STAT BETA HCG BLOOD, ED ([facility], [facility], [facility] ONLY)    EKG None  Radiology No results found.  Procedures Procedures (including critical care time)  Medications Ordered in ED Medications  sodium chloride 0.9 % bolus 1,000 mL (0 mLs Intravenous Stopped 12/08/17 1702)  ondansetron (ZOFRAN) injection 4 mg (4 mg Intravenous Given 12/08/17  1456)  HYDROmorphone (DILAUDID) injection 1 mg (1 mg Intravenous Given 12/08/17 1456)  ondansetron (ZOFRAN) injection 4 mg (4 mg Intravenous Given 12/08/17 1703)  HYDROmorphone (DILAUDID) injection 1 mg (1 mg Intravenous Given 12/08/17 1706)     Initial Impression / Assessment and Plan / ED Course  I have reviewed the triage vital signs and the nursing notes.  Pertinent labs & imaging results that were available during my care of the patient were reviewed by me and considered in my medical decision making (see chart for details).     MDM  Screen complete  Patient is presenting with acute on chronic abdominal pain.  Pain may be related to chronic pancreatitis.  Screening labs today are without evidence of acute pathology.  Lipase is normal.  White count is normal.  Patient is afebrile.  I do not feel that the patient would benefit from additional imaging studies at this time.  Administration of pain medicines in the  ED has given the patient significant relief.  Following her evaluation she desires discharge home.  She understands need for close follow-up.  Strict return precautions are given and understood.  Final Clinical Impressions(s) / ED Diagnoses   Final diagnoses:  Nausea and vomiting, intractability of vomiting not specified, unspecified vomiting type  Abdominal pain, unspecified abdominal location    ED Discharge Orders         Ordered    oxyCODONE-acetaminophen (PERCOCET/ROXICET) 5-325 MG tablet  Every 6 hours PRN     12/08/17 1716    ondansetron (ZOFRAN) 4 MG tablet  Every 6 hours     12/08/17 1723           Wynetta Fines, MD 12/08/17 1725

## 2017-12-11 ENCOUNTER — Other Ambulatory Visit: Payer: Self-pay | Admitting: *Deleted

## 2017-12-11 ENCOUNTER — Telehealth: Payer: Self-pay

## 2017-12-11 MED ORDER — OXYCODONE-ACETAMINOPHEN 5-325 MG PO TABS: 1.0000 | ORAL_TABLET | Freq: Four times a day (QID) | ORAL | 0 refills | Status: DC | PRN

## 2017-12-11 NOTE — Telephone Encounter (Signed)
Request sent in new encounter 

## 2017-12-11 NOTE — Telephone Encounter (Signed)
Checked [facility] database - has gotten 12 days worth since 9/23 (17 days ago) based on sig (total 75 pills). Has 15 days to go till GI appt. Will Rx 75 to last until GI appt.  Send to PCP to decide on physician appt since has no future appt in [facility].

## 2017-12-11 NOTE — Telephone Encounter (Signed)
oxyCODONE-acetaminophen (PERCOCET) 10-325 MG tablet   Refill request @  [facility] [phone] [place], [place] - [address] AT [facility] [phone] (Phone) [phone] (Fax)   Requesting to speak with a nurse. Please call pt back.

## 2017-12-16 ENCOUNTER — Other Ambulatory Visit: Payer: Self-pay | Admitting: General Surgery

## 2017-12-17 ENCOUNTER — Telehealth: Payer: Self-pay

## 2017-12-17 NOTE — Telephone Encounter (Signed)
OPEN ERROR

## 2017-12-23 ENCOUNTER — Other Ambulatory Visit: Payer: Self-pay | Admitting: Internal Medicine

## 2017-12-23 NOTE — Telephone Encounter (Signed)
Refill Request   ondansetron (ZOFRAN) 4 MG tablet  [id number] - [place], [place] - [address]

## 2017-12-24 ENCOUNTER — Ambulatory Visit: Payer: Self-pay

## 2017-12-24 MED ORDER — ONDANSETRON HCL 4 MG PO TABS: 4.0000 mg | ORAL_TABLET | Freq: Four times a day (QID) | ORAL | 0 refills | Status: DC

## 2017-12-30 ENCOUNTER — Ambulatory Visit: Payer: Self-pay

## 2017-12-30 ENCOUNTER — Encounter: Payer: Self-pay | Admitting: Internal Medicine

## 2018-01-02 ENCOUNTER — Other Ambulatory Visit: Payer: Self-pay

## 2018-01-02 ENCOUNTER — Encounter (HOSPITAL_COMMUNITY): Payer: Self-pay

## 2018-01-02 ENCOUNTER — Encounter (HOSPITAL_COMMUNITY)
Admission: RE | Admit: 2018-01-02 | Discharge: 2018-01-02 | Disposition: A | Discharge status: Home / Self Care | Payer: Self-pay | Source: Ambulatory Visit | Attending: General Surgery | Admitting: General Surgery

## 2018-01-02 DIAGNOSIS — Z01812 Encounter for preprocedural laboratory examination: Secondary | ICD-10-CM | POA: Insufficient documentation

## 2018-01-02 DIAGNOSIS — K869 Disease of pancreas, unspecified: Secondary | ICD-10-CM | POA: Insufficient documentation

## 2018-01-02 LAB — COMPREHENSIVE METABOLIC PANEL
ALT: 14 U/L (ref 0–44)
AST: 22 U/L (ref 15–41)
Albumin: 4.2 g/dL (ref 3.5–5.0)
Alkaline Phosphatase: 57 U/L (ref 38–126)
Anion gap: 8 (ref 5–15)
BUN: 10 mg/dL (ref 6–20)
CALCIUM: 9.5 mg/dL (ref 8.9–10.3)
CHLORIDE: 104 mmol/L (ref 98–111)
CO2: 26 mmol/L (ref 22–32)
CREATININE: 0.75 mg/dL (ref 0.44–1.00)
Glucose, Bld: 92 mg/dL (ref 70–99)
Potassium: 4.1 mmol/L (ref 3.5–5.1)
Sodium: 138 mmol/L (ref 135–145)
TOTAL PROTEIN: 7.1 g/dL (ref 6.5–8.1)
Total Bilirubin: 0.7 mg/dL (ref 0.3–1.2)

## 2018-01-02 LAB — CBC WITH DIFFERENTIAL/PLATELET
ABS IMMATURE GRANULOCYTES: 0.03 10*3/uL (ref 0.00–0.07)
BASOS PCT: 1 %
Basophils Absolute: 0.1 10*3/uL (ref 0.0–0.1)
EOS ABS: 0.3 10*3/uL (ref 0.0–0.5)
EOS PCT: 3 %
HCT: 42.2 % (ref 36.0–46.0)
Hemoglobin: 14.1 g/dL (ref 12.0–15.0)
Immature Granulocytes: 0 %
Lymphocytes Relative: 25 %
Lymphs Abs: 2.7 10*3/uL (ref 0.7–4.0)
MCH: 31.6 pg (ref 26.0–34.0)
MCHC: 33.4 g/dL (ref 30.0–36.0)
MCV: 94.6 fL (ref 80.0–100.0)
MONO ABS: 0.6 10*3/uL (ref 0.1–1.0)
MONOS PCT: 5 %
Neutro Abs: 7.4 10*3/uL (ref 1.7–7.7)
Neutrophils Relative %: 66 %
PLATELETS: 235 10*3/uL (ref 150–400)
RBC: 4.46 MIL/uL (ref 3.87–5.11)
RDW: 12.1 % (ref 11.5–15.5)
WBC: 11.1 10*3/uL — ABNORMAL HIGH (ref 4.0–10.5)
nRBC: 0 % (ref 0.0–0.2)

## 2018-01-02 LAB — LIPASE, BLOOD: Lipase: 25 U/L (ref 11–51)

## 2018-01-02 LAB — ABO/RH: ABO/RH(D): O POS

## 2018-01-02 LAB — PREPARE RBC (CROSSMATCH)

## 2018-01-02 MED ORDER — CHLORHEXIDINE GLUCONATE CLOTH 2 % EX PADS: 6.0000 | MEDICATED_PAD | Freq: Once | CUTANEOUS | Status: DC

## 2018-01-02 NOTE — Pre-Procedure Instructions (Signed)
Yaffa Seckman  01/02/2018      [id number] [address], [place] [phone] [facility] [address] [facility] [address] [place] [address] Phone: [phone] Fax: [phone]    Your procedure is scheduled on January 07, 2018.  Report to [facility] Admitting at [place] AM.  Call this number if you have problems the morning of surgery:  [phone]   Remember:  Do not eat after midnight.  You may drink clear liquids until 1030 AM.  Clear liquids allowed are: Water, Juice (non-citric and without pulp), Clear Tea, Black Coffee only and Gatorade -DO NOT ADD MILK OR SUGAR     Take these medicines the morning of surgery with A SIP OF WATER  Afrin Nasal spray-if needed Oxycodone-acetaminophen (percocet)-if needed Ondansetron (zofran)-If needed  7 days prior to surgery STOP taking any Aspirin (unless otherwise instructed by your surgeon), Aleve, Naproxen, Ibuprofen, Motrin, Advil, Goody's, BC's, all herbal medications, fish oil, and all vitamins    Do not wear jewelry, make-up or nail polish.  Do not wear lotions, powders, or perfumes, or deodorant.  Do not shave 48 hours prior to surgery.    Do not bring valuables to the hospital.  [facility] is not responsible for any belongings or valuables.  Contacts, dentures or bridgework may not be worn into surgery.  Leave your suitcase in the car.  After surgery it may be brought to your room.  For patients admitted to the hospital, discharge time will be determined by your treatment team.  Patients discharged the day of surgery will not be allowed to drive home.    [facility]- Preparing For Surgery  Before surgery, you can play an important role. Because skin is not sterile, your skin needs to be as free of germs as possible. You can reduce the number of germs on your skin by washing with CHG (chlorahexidine gluconate) Soap before surgery.  CHG is an antiseptic cleaner which kills germs and bonds with the  skin to continue killing germs even after washing.    Oral Hygiene is also important to reduce your risk of infection.  Remember - BRUSH YOUR TEETH THE MORNING OF SURGERY WITH YOUR REGULAR TOOTHPASTE  Please do not use if you have an allergy to CHG or antibacterial soaps. If your skin becomes reddened/irritated stop using the CHG.  Do not shave (including legs and underarms) for at least 48 hours prior to first CHG shower. It is OK to shave your face.  Please follow these instructions carefully.   1. Shower the NIGHT BEFORE SURGERY and the MORNING OF SURGERY with CHG.   2. If you chose to wash your hair, wash your hair first as usual with your normal shampoo.  3. After you shampoo, rinse your hair and body thoroughly to remove the shampoo.  4. Use CHG as you would any other liquid soap. You can apply CHG directly to the skin and wash gently with a scrungie or a clean washcloth.   5. Apply the CHG Soap to your body ONLY FROM THE NECK DOWN.  Do not use on open wounds or open sores. Avoid contact with your eyes, ears, mouth and genitals (private parts). Wash Face and genitals (private parts)  with your normal soap.  6. Wash thoroughly, paying special attention to the area where your surgery will be performed.  7. Thoroughly rinse your body with warm water from the neck down.  8. DO NOT shower/wash with  your normal soap after using and rinsing off the CHG Soap.  9. Pat yourself dry with a CLEAN TOWEL.  10. Wear CLEAN PAJAMAS to bed the night before surgery, wear comfortable clothes the morning of surgery  11. Place CLEAN SHEETS on your bed the night of your first shower and DO NOT SLEEP WITH PETS.  Day of Surgery:  Do not apply any deodorants/lotions.  Please wear clean clothes to the hospital/surgery center.   Remember to brush your teeth WITH YOUR REGULAR TOOTHPASTE.   Please read over the fact sheets that you were given.

## 2018-01-02 NOTE — Progress Notes (Addendum)
PCP: Chevis Pretty, MD  Cardiologist: pt denies  EKG: denies past year  Stress test: pt denies  ECHO: pt denies  Cardiac Cath: pt denies  Chest x-ray: pt denies past year, no recent respiratory infection/complications

## 2018-01-03 DIAGNOSIS — K8689 Other specified diseases of pancreas: Secondary | ICD-10-CM

## 2018-01-03 HISTORY — DX: Other specified diseases of pancreas: K86.89

## 2018-01-06 NOTE — H&P (Signed)
Meredith Cohen Documented: 12/16/2017 9:43 AM Location: [facility] Patient #: [id number] DOB: Feb 22, 1971 Single / Language: [place] / Race: White Female   History of Present Illness Meredith Lint MD; 12/16/2017 11:15 AM) The patient is a 47 year old female who presents with a pancreatic mass. Patient is a 47 year old female referred for consultation by Dr. Dulce Sellar for diagnosis of pancreatic tail mass. The patient presented to the emergency department with severe abdominal pain and nausea. A CT renal stone protocol was performed which showed a pancreatic tail mass. There was inflammation surrounding it and calcification. She was admitted and had an MRI as well as a GI consult with endoscopic ultrasound and biopsy. The EUS and biopsy was a bit difficult because of the vasculature at the site of the mass. The mass is very close to the hilum. The patient was placed on Percocet prior to leaving the hospital. She is still taking this and it is keeping the pain at a more manageable level, but is definitely not controlling it well. She also continues to have some nausea.  She is adopted and does not know any family history. She has no personal history of cancer or pancreatitis of which she is aware.  EUS 11/19/2017 ENDOSONOGRAPHIC FINDING: There was diffuse abnormal echotexture in the left lobe of the liver. This was characterized by a hyperechoic appearance. The pancreatic duct had an irregularly contoured endosonographic appearance in the genu of the pancreas, body of the pancreas and tail of the pancreas. Endosonographic imaging of the pancreas showed sonographic changes indicative of moderate chronic pancreatitis in the genu of the pancreas, pancreatic body and pancreatic tail. The parenchyma had hyperechoic strands, hyperechoic foci and hypoechoic foci. The pancreatic duct measured up to 3 mm in diameter. No lymphadenopathy seen. An irregular mass was identified in the  pancreatic tail. The mass was hypoechoic, heterogenous, calcified and mixed solid and cystic. The mass measured 17 mm by 22 mm in maximal cross-sectional diameter. The endosonographic borders were poorly-defined. Fine needle aspiration for cytology was performed. Color Doppler imaging was utilized prior to needle puncture to confirm a lack of significant vascular structures within the needle path; could not biopsy in the "center" of the lesion because of two large overlying vessels; biopsies were done around the margin of the lesion Three passes were made with the 25 gauge needle using a transgastric approach. A stylet was used. A preliminary cytologic examination was not performed. Final cytology results are pending.  cytology 11/19/2017 Diagnosis PANCREAS, FINE NEEDLE ASPIRATION, TAIL MASS, A (SPECIMEN 1 OF 1, COLLECTED 11/19/17): ATYPICAL CELLS PRESENT. SEE COMMENT. COMMENT: THE ATYPICAL CELLS ARE NOT DEFINITIVELY DIAGNOSTIC OF NEOPLASIA IN MY OPINION.  MRI 11/15/2017 IMPRESSION: 1. Arterial phase hypoenhancing mass in the tail the pancreas with coarse central calcification and surrounding peripancreatic inflammatory stranding. The mass appears to have some accentuation of enhancement on later phase images. There are adjacent vessels of the splenic hilum. While the coarse internal calcification would be an unusual feature for pancreatic adenocarcinoma, pancreatic neuroendocrine cancer can notably have coarse irregular central calcification similar to this lesion and is the top differential diagnostic consideration. The inflammatory findings directly surrounding this mass also raise the possibility of focal acute on chronic pancreatitis as a potential cause. Metastatic disease to the pancreas can sometimes be calcified. Other types of calcified pancreatic lesions usually have a different pattern, such as peripheral or linear calcifications rather than the coarse central calcifications  seen on this case. There are some closely associated vessels  of the splenic hilum, and some form of calcified vascular malformation in close approximation to the pancreatic tail is likewise a differential diagnostic consideration, but seems less likely in light of the early hypoenhancement. No metastatic lesions are evident in the liver. Tissue diagnosis is likely warranted, or alternatively if the possibility of acute on chronic focal pancreatitis is suspected clinically, surveillance imaging might be considered. 2. 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 No biliary dilatation. 3. Aortic Atherosclerosis (ICD10-I70.0).  CT renal stone 11/14/17 IMPRESSION: 1. No nephroureterolithiasis. No hydronephrosis. No acute process within the abdomen or pelvis. 2. Indeterminate mass with central calcification within the pancreatic tail. This needs dedicated evaluation with pancreatic protocol CT or MRI. Small amount of surrounding fat stranding. Acute superimposed process is not excluded. 3. Lobular contour of the liver which is nonspecific, cirrhotic etiology not excluded. Recommend attention for the possibility of hepatic mass on contrast-enhanced CT or MRI.  CMET, CBC 12/08/2017 essentially normal.   11/20/2017 CEA sl elevated at 5.8. CA 19-9, AFP normal.    Past Surgical History (Tanisha A. Manson Passey, RMA; 12/16/2017 10:18 AM) Tonsillectomy   Diagnostic Studies History (Tanisha A. Manson Passey, RMA; 12/16/2017 10:18 AM) Mammogram  never Pap Smear  1-5 years ago  Allergies (Tanisha A. Manson Passey, RMA; 12/16/2017 10:19 AM) Penicillins   Medication History (Tanisha A. Manson Passey, RMA; 12/16/2017 10:21 AM) oxyCODONE-Acetaminophen (5-325MG  Tablet, Oral) Active. Multivitamin Adult (Oral) Active. Vitamin B-12 ( Tablet, Oral) Active. Omega 3 (1000MG  Capsule, Oral) Active. Medications Reconciled  Social History (Tanisha A. Manson Passey, RMA; 12/16/2017 10:18 AM) Alcohol use  Remotely  quit alcohol use. Caffeine use  Carbonated beverages. No drug use  Tobacco use  Current every day smoker.  Family History (Tanisha A. Manson Passey, RMA; 12/16/2017 10:18 AM) Family history unknown  First Degree Relatives   Pregnancy / Birth History (Tanisha A. Manson Passey, RMA; 12/16/2017 10:18 AM) Age at menarche  12 years. Contraceptive History  Depo-provera, Oral contraceptives. Gravida  3 Length (months) of breastfeeding  3-6 Maternal age  8-25 Para  2 Regular periods   Other Problems (Tanisha A. Manson Passey, RMA; 12/16/2017 10:18 AM) Cirrhosis Of Liver  Migraine Headache  Pancreatitis  Thyroid Disease     Review of Systems (Tanisha A. Brown RMA; 12/16/2017 10:18 AM) General Present- Appetite Loss and Weight Loss. Not Present- Chills, Fatigue, Fever, Night Sweats and Weight Gain. Skin Not Present- Change in Wart/Mole, Dryness, Hives, Jaundice, New Lesions, Non-Healing Wounds, Rash and Ulcer. HEENT Present- Seasonal Allergies. Not Present- Earache, Hearing Loss, Hoarseness, Nose Bleed, Oral Ulcers, Ringing in the Ears, Sinus Pain, Sore Throat, Visual Disturbances, Wears glasses/contact lenses and Yellow Eyes. Respiratory Not Present- Bloody sputum, Chronic Cough, Difficulty Breathing, Snoring and Wheezing. Breast Not Present- Breast Mass, Breast Pain, Nipple Discharge and Skin Changes. Cardiovascular Not Present- Chest Pain, Difficulty Breathing Lying Down, Leg Cramps, Palpitations, Rapid Heart Rate, Shortness of Breath and Swelling of Extremities. Gastrointestinal Present- Abdominal Pain, Change in Bowel Habits, Nausea and Vomiting. Not Present- Bloating, Bloody Stool, Chronic diarrhea, Constipation, Difficulty Swallowing, Excessive gas, Gets full quickly at meals, Hemorrhoids, Indigestion and Rectal Pain. Female Genitourinary Not Present- Frequency, Nocturia, Painful Urination, Pelvic Pain and Urgency. Musculoskeletal Present- Back Pain. Not Present- Joint Pain, Joint Stiffness,  Muscle Pain, Muscle Weakness and Swelling of Extremities. Neurological Not Present- Decreased Memory, Fainting, Headaches, Numbness, Seizures, Tingling, Tremor, Trouble walking and Weakness. Psychiatric Not Present- Anxiety, Bipolar, Change in Sleep Pattern, Depression, Fearful and Frequent crying. Endocrine Not Present- Cold Intolerance, Excessive Hunger, Hair Changes, Heat Intolerance, Hot flashes  and New Diabetes. Hematology Not Present- Blood Thinners, Easy Bruising, Excessive bleeding, Gland problems, HIV and Persistent Infections.  Vitals (Tanisha A. Brown RMA; 12/16/2017 10:19 AM) 12/16/2017 10:19 AM Weight: 142.5 lb Temp.: 98.3F  Pulse: 118 (Regular)  P.OX: 96% (Room air) BP: 102/72 (Sitting, Left Arm, Standard)       Physical Exam Meredith Lint MD; 12/16/2017 11:15 AM) General Mental Status-Alert. General Appearance-Consistent with stated age. Hydration-Well hydrated. Voice-Normal.  Head and Neck Head-normocephalic, atraumatic with no lesions or palpable masses. Trachea-midline. Thyroid Gland Characteristics - normal size and consistency.  Eye Eyeball - Bilateral-Extraocular movements intact. Sclera/Conjunctiva - Bilateral-No scleral icterus.  Chest and Lung Exam Chest and lung exam reveals -quiet, even and easy respiratory effort with no use of accessory muscles and on auscultation, normal breath sounds, no adventitious sounds and normal vocal resonance. Inspection Chest Wall - Normal. Back - normal.  Cardiovascular Cardiovascular examination reveals -normal heart sounds, regular rate and rhythm with no murmurs and normal pedal pulses bilaterally.  Abdomen Inspection Inspection of the abdomen reveals - No Hernias. Palpation/Percussion Palpation and Percussion of the abdomen reveal - Soft, No Rebound tenderness, No Rigidity (guarding) and No hepatosplenomegaly. Note: mild LUQ tenderness. Auscultation Auscultation of the abdomen  reveals - Bowel sounds normal.  Neurologic Neurologic evaluation reveals -alert and oriented x 3 with no impairment of recent or remote memory. Mental Status-Normal.  Musculoskeletal Global Assessment -Note: no gross deformities.  Normal Exam - Left-Upper Extremity Strength Normal and Lower Extremity Strength Normal. Normal Exam - Right-Upper Extremity Strength Normal and Lower Extremity Strength Normal.  Lymphatic Head & Neck  General Head & Neck Lymphatics: Bilateral - Description - Normal. Axillary  General Axillary Region: Bilateral - Description - Normal. Tenderness - Non Tender. Femoral & Inguinal  Generalized Femoral & Inguinal Lymphatics: Bilateral - Description - No Generalized lymphadenopathy.    Assessment & Plan Meredith Lint MD; 12/16/2017 11:18 AM) PANCREATIC MASS (K86.89) Impression: Patient has a new diagnosis of a distal pancreatic mass. The diagnosis is uncertain. It is mixed solid and cystic and certainly could be neoplastic in origin. Because of her young age and symptoms, I definitely think we should move toward removing this. There is a possibility that it is benign and related to pancreatitis, but the risk of leaving it is too high. Also, it is doubtful that it would resolve significantly enough to feel good leaving it in place.  I discussed the surgery with diagrams of anatomy. I reviewed that I would do a hand-assisted approach and will remove the spleen. We talked about post splenectomy vaccines. I discussed risks including pancreatic leak, bleeding, infection, heart or lung complications, hernia, and death. I discussed that her risk are slightly higher because of her smoking. I advised that some of these risk would improve if she were to quit smoking preoperatively.  I reviewed postop time in the hospital as well as expected recovery. Discussed that she would have a drain and may need an additional drain if she were to develop a pancreatic leak  and/or infection.  She understands and is agreeable to have the surgery done as soon as possible. Current Plans You are being scheduled for surgery- Our schedulers will call you.  You should hear from our office's scheduling department within 5 working days about the location, date, and time of surgery. We try to make accommodations for patient's preferences in scheduling surgery, but sometimes the OR schedule or the surgeon's schedule prevents [place] from making those accommodations.  If you have  not heard from our office [phone]) in 5 working days, call the office and ask for your surgeon's nurse.  If you have other questions about your diagnosis, plan, or surgery, call the office and ask for your surgeon's nurse.  Pt Education - FB pancreatectomy   Signed by Meredith Lint, MD (12/16/2017 11:18 AM)

## 2018-01-07 ENCOUNTER — Inpatient Hospital Stay (HOSPITAL_COMMUNITY): Payer: Self-pay | Admitting: Certified Registered Nurse Anesthetist

## 2018-01-07 ENCOUNTER — Encounter (HOSPITAL_COMMUNITY): Payer: Self-pay

## 2018-01-07 ENCOUNTER — Inpatient Hospital Stay (HOSPITAL_COMMUNITY)
Admission: RE | Admit: 2018-01-07 | Discharge: 2018-01-13 | DRG: 407 | Disposition: A | Discharge status: Home / Self Care | Payer: Self-pay | Attending: General Surgery | Admitting: General Surgery

## 2018-01-07 ENCOUNTER — Encounter (HOSPITAL_COMMUNITY)
Admission: RE | Disposition: A | Discharge status: Home / Self Care | Payer: Self-pay | Source: Home / Self Care | Attending: General Surgery

## 2018-01-07 DIAGNOSIS — K8689 Other specified diseases of pancreas: Secondary | ICD-10-CM | POA: Diagnosis present

## 2018-01-07 DIAGNOSIS — K7460 Unspecified cirrhosis of liver: Secondary | ICD-10-CM | POA: Diagnosis present

## 2018-01-07 DIAGNOSIS — Z79891 Long term (current) use of opiate analgesic: Secondary | ICD-10-CM

## 2018-01-07 DIAGNOSIS — F17200 Nicotine dependence, unspecified, uncomplicated: Secondary | ICD-10-CM | POA: Diagnosis present

## 2018-01-07 DIAGNOSIS — Z79899 Other long term (current) drug therapy: Secondary | ICD-10-CM

## 2018-01-07 DIAGNOSIS — K859 Acute pancreatitis without necrosis or infection, unspecified: Principal | ICD-10-CM | POA: Diagnosis present

## 2018-01-07 DIAGNOSIS — Z880 Allergy status to penicillin: Secondary | ICD-10-CM

## 2018-01-07 HISTORY — DX: Other specified diseases of pancreas: K86.89

## 2018-01-07 HISTORY — PX: PANCREATECTOMY: SHX1019

## 2018-01-07 LAB — CBC
HEMATOCRIT: 30.9 % — AB (ref 36.0–46.0)
HEMOGLOBIN: 10.1 g/dL — AB (ref 12.0–15.0)
MCH: 31.8 pg (ref 26.0–34.0)
MCHC: 32.7 g/dL (ref 30.0–36.0)
MCV: 97.2 fL (ref 80.0–100.0)
Platelets: 171 10*3/uL (ref 150–400)
RBC: 3.18 MIL/uL — AB (ref 3.87–5.11)
RDW: 12.1 % (ref 11.5–15.5)
WBC: 12.3 10*3/uL — ABNORMAL HIGH (ref 4.0–10.5)
nRBC: 0 % (ref 0.0–0.2)

## 2018-01-07 LAB — HCG, SERUM, QUALITATIVE: PREG SERUM: NEGATIVE

## 2018-01-07 LAB — CREATININE, SERUM
Creatinine, Ser: 0.44 mg/dL (ref 0.44–1.00)
GFR calc Af Amer: >60 mL/min (ref >60)
GFR calc non Af Amer: >60 mL/min (ref >60)

## 2018-01-07 SURGERY — PANCREATECTOMY
Anesthesia: General | Site: Abdomen

## 2018-01-07 MED ORDER — ACETAMINOPHEN 500 MG PO TABS
1000.0000 mg | ORAL_TABLET | ORAL | Status: AC
  Administered 2018-01-07: 1000 mg via ORAL
  Filled 2018-01-07: qty 2

## 2018-01-07 MED ORDER — ROCURONIUM BROMIDE 50 MG/5ML IV SOSY
PREFILLED_SYRINGE | INTRAVENOUS | Status: AC
  Filled 2018-01-07: qty 5

## 2018-01-07 MED ORDER — PROCHLORPERAZINE MALEATE 10 MG PO TABS
10.0000 mg | ORAL_TABLET | Freq: Four times a day (QID) | ORAL | Status: DC | PRN
  Filled 2018-01-07: qty 1

## 2018-01-07 MED ORDER — GABAPENTIN 300 MG PO CAPS
300.0000 mg | ORAL_CAPSULE | ORAL | Status: AC
  Administered 2018-01-07: 300 mg via ORAL
  Filled 2018-01-07: qty 1

## 2018-01-07 MED ORDER — DIPHENHYDRAMINE HCL 12.5 MG/5ML PO ELIX: 12.5000 mg | ORAL_SOLUTION | Freq: Four times a day (QID) | ORAL | Status: DC | PRN

## 2018-01-07 MED ORDER — METOPROLOL TARTRATE 5 MG/5ML IV SOLN: 5.0000 mg | Freq: Four times a day (QID) | INTRAVENOUS | Status: DC | PRN

## 2018-01-07 MED ORDER — ACETAMINOPHEN 325 MG PO TABS
650.0000 mg | ORAL_TABLET | Freq: Four times a day (QID) | ORAL | Status: DC | PRN
  Administered 2018-01-09: 650 mg via ORAL
  Filled 2018-01-07: qty 2

## 2018-01-07 MED ORDER — HYDROMORPHONE HCL 1 MG/ML IJ SOLN
INTRAMUSCULAR | Status: AC
  Filled 2018-01-07: qty 1

## 2018-01-07 MED ORDER — ENOXAPARIN SODIUM 40 MG/0.4ML ~~LOC~~ SOLN
40.0000 mg | SUBCUTANEOUS | Status: DC
  Administered 2018-01-08 – 2018-01-11 (×4): 40 mg via SUBCUTANEOUS
  Filled 2018-01-07 (×4): qty 0.4

## 2018-01-07 MED ORDER — SODIUM CHLORIDE 0.9 % IV SOLN
INTRAVENOUS | Status: DC | PRN
  Administered 2018-01-07: 40 ug/min via INTRAVENOUS
  Administered 2018-01-07: 20 ug/min via INTRAVENOUS
  Administered 2018-01-07: 40 ug/min via INTRAVENOUS

## 2018-01-07 MED ORDER — PROPOFOL 10 MG/ML IV BOLUS
INTRAVENOUS | Status: DC | PRN
  Administered 2018-01-07: 150 mg via INTRAVENOUS
  Administered 2018-01-07: 50 mg via INTRAVENOUS

## 2018-01-07 MED ORDER — HYDROMORPHONE 1 MG/ML IV SOLN
INTRAVENOUS | Status: DC
  Administered 2018-01-07: 25 mg via INTRAVENOUS
  Administered 2018-01-08: 3.3 mg via INTRAVENOUS
  Administered 2018-01-08: 2.7 mg via INTRAVENOUS
  Administered 2018-01-08: 1.8 mg via INTRAVENOUS
  Administered 2018-01-08: 0.9 mg via INTRAVENOUS
  Administered 2018-01-08: 5.3 mg via INTRAVENOUS
  Administered 2018-01-08: 3.3 mg via INTRAVENOUS
  Administered 2018-01-08: 2.4 mg via INTRAVENOUS
  Administered 2018-01-08: 0.9 mg via INTRAVENOUS
  Administered 2018-01-09: 2.1 mg via INTRAVENOUS
  Filled 2018-01-07 (×2): qty 25

## 2018-01-07 MED ORDER — CIPROFLOXACIN IN D5W 400 MG/200ML IV SOLN
400.0000 mg | INTRAVENOUS | Status: AC
  Administered 2018-01-07: 400 mg via INTRAVENOUS
  Filled 2018-01-07: qty 200

## 2018-01-07 MED ORDER — MIDAZOLAM HCL 5 MG/5ML IJ SOLN
INTRAMUSCULAR | Status: DC | PRN
  Administered 2018-01-07: 2 mg via INTRAVENOUS

## 2018-01-07 MED ORDER — LACTATED RINGERS IV SOLN
INTRAVENOUS | Status: DC | PRN
  Administered 2018-01-07 (×2): via INTRAVENOUS

## 2018-01-07 MED ORDER — OXYMETAZOLINE HCL 0.05 % NA SOLN
1.0000 | Freq: Three times a day (TID) | NASAL | Status: DC | PRN
  Filled 2018-01-07: qty 15

## 2018-01-07 MED ORDER — FENTANYL CITRATE (PF) 250 MCG/5ML IJ SOLN
INTRAMUSCULAR | Status: AC
  Filled 2018-01-07: qty 5

## 2018-01-07 MED ORDER — 0.9 % SODIUM CHLORIDE (POUR BTL) OPTIME
TOPICAL | Status: DC | PRN
  Administered 2018-01-07: 1000 mL

## 2018-01-07 MED ORDER — SODIUM CHLORIDE 0.9% FLUSH: 9.0000 mL | INTRAVENOUS | Status: DC | PRN

## 2018-01-07 MED ORDER — SUGAMMADEX SODIUM 200 MG/2ML IV SOLN
INTRAVENOUS | Status: DC | PRN
  Administered 2018-01-07: 130 mg via INTRAVENOUS

## 2018-01-07 MED ORDER — NALOXONE HCL 0.4 MG/ML IJ SOLN: 0.4000 mg | INTRAMUSCULAR | Status: DC | PRN

## 2018-01-07 MED ORDER — LIDOCAINE 2% (20 MG/ML) 5 ML SYRINGE
INTRAMUSCULAR | Status: DC | PRN
  Administered 2018-01-07: 60 mg via INTRAVENOUS

## 2018-01-07 MED ORDER — DEXMEDETOMIDINE HCL IN NACL 200 MCG/50ML IV SOLN
INTRAVENOUS | Status: DC | PRN
  Administered 2018-01-07 (×2): 8 ug via INTRAVENOUS
  Administered 2018-01-07: 4 ug via INTRAVENOUS

## 2018-01-07 MED ORDER — DIPHENHYDRAMINE HCL 50 MG/ML IJ SOLN: 12.5000 mg | Freq: Four times a day (QID) | INTRAMUSCULAR | Status: DC | PRN

## 2018-01-07 MED ORDER — BUPIVACAINE LIPOSOME 1.3 % IJ SUSP
20.0000 mL | INTRAMUSCULAR | Status: AC
  Administered 2018-01-07: 20 mL
  Filled 2018-01-07: qty 20

## 2018-01-07 MED ORDER — NICOTINE 14 MG/24HR TD PT24: 14.0000 mg | MEDICATED_PATCH | Freq: Every day | TRANSDERMAL | Status: DC | PRN

## 2018-01-07 MED ORDER — METHOCARBAMOL 500 MG PO TABS
500.0000 mg | ORAL_TABLET | Freq: Four times a day (QID) | ORAL | Status: DC | PRN
  Administered 2018-01-09 – 2018-01-13 (×5): 500 mg via ORAL
  Filled 2018-01-07 (×5): qty 1

## 2018-01-07 MED ORDER — BUPIVACAINE-EPINEPHRINE (PF) 0.25% -1:200000 IJ SOLN
INTRAMUSCULAR | Status: DC | PRN
  Administered 2018-01-07: 30 mL

## 2018-01-07 MED ORDER — KCL-LACTATED RINGERS-D5W 20 MEQ/L IV SOLN
INTRAVENOUS | Status: AC
  Administered 2018-01-07 – 2018-01-08 (×3): via INTRAVENOUS
  Filled 2018-01-07 (×3): qty 1000

## 2018-01-07 MED ORDER — PHENYLEPHRINE 40 MCG/ML (10ML) SYRINGE FOR IV PUSH (FOR BLOOD PRESSURE SUPPORT)
PREFILLED_SYRINGE | INTRAVENOUS | Status: DC | PRN
  Administered 2018-01-07: 160 ug via INTRAVENOUS

## 2018-01-07 MED ORDER — EVICEL 5 ML EX KIT
PACK | CUTANEOUS | Status: AC
  Filled 2018-01-07: qty 1

## 2018-01-07 MED ORDER — ONDANSETRON HCL 4 MG/2ML IJ SOLN
4.0000 mg | Freq: Four times a day (QID) | INTRAMUSCULAR | Status: DC | PRN
  Administered 2018-01-07: 4 mg via INTRAVENOUS
  Filled 2018-01-07: qty 2

## 2018-01-07 MED ORDER — MIDAZOLAM HCL 2 MG/2ML IJ SOLN
INTRAMUSCULAR | Status: AC
  Filled 2018-01-07: qty 2

## 2018-01-07 MED ORDER — LIDOCAINE HCL 1 % IJ SOLN
INTRAMUSCULAR | Status: DC | PRN
  Administered 2018-01-07: 30 mg

## 2018-01-07 MED ORDER — PROCHLORPERAZINE EDISYLATE 10 MG/2ML IJ SOLN: 5.0000 mg | Freq: Four times a day (QID) | INTRAMUSCULAR | Status: DC | PRN

## 2018-01-07 MED ORDER — CIPROFLOXACIN IN D5W 400 MG/200ML IV SOLN
400.0000 mg | Freq: Two times a day (BID) | INTRAVENOUS | Status: AC
  Administered 2018-01-08: 400 mg via INTRAVENOUS
  Filled 2018-01-07: qty 200

## 2018-01-07 MED ORDER — CELECOXIB 200 MG PO CAPS
200.0000 mg | ORAL_CAPSULE | ORAL | Status: AC
  Administered 2018-01-07: 200 mg via ORAL
  Filled 2018-01-07: qty 1

## 2018-01-07 MED ORDER — ACETAMINOPHEN 650 MG RE SUPP: 650.0000 mg | Freq: Four times a day (QID) | RECTAL | Status: DC | PRN

## 2018-01-07 MED ORDER — BUPIVACAINE-EPINEPHRINE (PF) 0.25% -1:200000 IJ SOLN
INTRAMUSCULAR | Status: AC
  Filled 2018-01-07: qty 30

## 2018-01-07 MED ORDER — DEXAMETHASONE SODIUM PHOSPHATE 10 MG/ML IJ SOLN
INTRAMUSCULAR | Status: DC | PRN
  Administered 2018-01-07: 10 mg via INTRAVENOUS

## 2018-01-07 MED ORDER — GABAPENTIN 300 MG PO CAPS
300.0000 mg | ORAL_CAPSULE | Freq: Two times a day (BID) | ORAL | Status: DC
  Administered 2018-01-07 – 2018-01-13 (×12): 300 mg via ORAL
  Filled 2018-01-07 (×12): qty 1

## 2018-01-07 MED ORDER — LIDOCAINE HCL (PF) 1 % IJ SOLN
INTRAMUSCULAR | Status: AC
  Filled 2018-01-07: qty 30

## 2018-01-07 MED ORDER — ONDANSETRON 4 MG PO TBDP: 4.0000 mg | ORAL_TABLET | Freq: Four times a day (QID) | ORAL | Status: DC | PRN

## 2018-01-07 MED ORDER — ROCURONIUM BROMIDE 10 MG/ML (PF) SYRINGE
PREFILLED_SYRINGE | INTRAVENOUS | Status: DC | PRN
  Administered 2018-01-07 (×2): 50 mg via INTRAVENOUS

## 2018-01-07 MED ORDER — SODIUM CHLORIDE 0.9 % IR SOLN
Status: DC | PRN
  Administered 2018-01-07: 1000 mL

## 2018-01-07 MED ORDER — KETOROLAC TROMETHAMINE 30 MG/ML IJ SOLN: 30.0000 mg | Freq: Four times a day (QID) | INTRAMUSCULAR | Status: DC | PRN

## 2018-01-07 MED ORDER — PROPOFOL 10 MG/ML IV BOLUS
INTRAVENOUS | Status: AC
  Filled 2018-01-07: qty 40

## 2018-01-07 MED ORDER — SENNA 8.6 MG PO TABS
1.0000 | ORAL_TABLET | Freq: Two times a day (BID) | ORAL | Status: DC
  Administered 2018-01-08 – 2018-01-13 (×8): 8.6 mg via ORAL
  Filled 2018-01-07 (×9): qty 1

## 2018-01-07 MED ORDER — DEXAMETHASONE SODIUM PHOSPHATE 10 MG/ML IJ SOLN
INTRAMUSCULAR | Status: AC
  Filled 2018-01-07: qty 1

## 2018-01-07 MED ORDER — ONDANSETRON HCL 4 MG/2ML IJ SOLN
INTRAMUSCULAR | Status: DC | PRN
  Administered 2018-01-07: 4 mg via INTRAVENOUS

## 2018-01-07 MED ORDER — KETOROLAC TROMETHAMINE 30 MG/ML IJ SOLN
30.0000 mg | Freq: Four times a day (QID) | INTRAMUSCULAR | Status: DC
  Administered 2018-01-08 – 2018-01-09 (×6): 30 mg via INTRAVENOUS
  Filled 2018-01-07 (×6): qty 1

## 2018-01-07 MED ORDER — ONDANSETRON HCL 4 MG/2ML IJ SOLN: 4.0000 mg | Freq: Four times a day (QID) | INTRAMUSCULAR | Status: DC | PRN

## 2018-01-07 MED ORDER — HYDROMORPHONE HCL 1 MG/ML IJ SOLN
0.2500 mg | INTRAMUSCULAR | Status: DC | PRN
  Administered 2018-01-07 (×2): 0.5 mg via INTRAVENOUS

## 2018-01-07 MED ORDER — FENTANYL CITRATE (PF) 250 MCG/5ML IJ SOLN
INTRAMUSCULAR | Status: DC | PRN
  Administered 2018-01-07: 50 ug via INTRAVENOUS
  Administered 2018-01-07: 100 ug via INTRAVENOUS
  Administered 2018-01-07 (×5): 50 ug via INTRAVENOUS

## 2018-01-07 MED ORDER — EVICEL 5 ML EX KIT
PACK | CUTANEOUS | Status: DC | PRN
  Administered 2018-01-07: 5 mL

## 2018-01-07 MED ORDER — ONDANSETRON HCL 4 MG/2ML IJ SOLN
INTRAMUSCULAR | Status: AC
  Filled 2018-01-07: qty 2

## 2018-01-07 MED ORDER — MUSCLE RUB 10-15 % EX CREA
TOPICAL_CREAM | Freq: Two times a day (BID) | CUTANEOUS | Status: DC | PRN
  Filled 2018-01-07: qty 85

## 2018-01-07 SURGICAL SUPPLY — 69 items
BAG BILE T-TUBES STRL (MISCELLANEOUS) ×3 IMPLANT
BIOPATCH RED 1 DISK 7.0 (GAUZE/BANDAGES/DRESSINGS) ×3 IMPLANT
BLADE CLIPPER SURG (BLADE) IMPLANT
CANISTER SUCT 3000ML PPV (MISCELLANEOUS) ×3 IMPLANT
CLIP VESOCCLUDE LG 6/CT (CLIP) IMPLANT
COVER SURGICAL LIGHT HANDLE (MISCELLANEOUS) ×3 IMPLANT
COVER WAND RF STERILE (DRAPES) ×3 IMPLANT
DERMABOND ADHESIVE PROPEN (GAUZE/BANDAGES/DRESSINGS) ×1
DERMABOND ADVANCED .7 DNX6 (GAUZE/BANDAGES/DRESSINGS) ×2 IMPLANT
DRAIN CHANNEL 19F RND (DRAIN) ×3 IMPLANT
DRAPE LAPAROSCOPIC ABDOMINAL (DRAPES) IMPLANT
DRAPE UTILITY XL STRL (DRAPES) IMPLANT
DRSG MEPILEX BORDER 4X8 (GAUZE/BANDAGES/DRESSINGS) ×3 IMPLANT
DRSG TEGADERM 4X4.75 (GAUZE/BANDAGES/DRESSINGS) ×3 IMPLANT
ELECT BLADE 6.5 EXT (BLADE) IMPLANT
ELECT REM PT RETURN 9FT ADLT (ELECTROSURGICAL) ×3
ELECTRODE REM PT RTRN 9FT ADLT (ELECTROSURGICAL) ×2 IMPLANT
GAUZE SPONGE 4X4 12PLY STRL (GAUZE/BANDAGES/DRESSINGS) ×6 IMPLANT
GEL PDS (MISCELLANEOUS) ×3 IMPLANT
GLOVE BIO SURGEON STRL SZ 6 (GLOVE) ×3 IMPLANT
GLOVE INDICATOR 6.5 STRL GRN (GLOVE) ×3 IMPLANT
GOWN STRL REUS W/ TWL LRG LVL3 (GOWN DISPOSABLE) ×8 IMPLANT
GOWN STRL REUS W/TWL 2XL LVL3 (GOWN DISPOSABLE) ×6 IMPLANT
GOWN STRL REUS W/TWL LRG LVL3 (GOWN DISPOSABLE) ×4
HEMOSTAT SURGICEL 2X14 (HEMOSTASIS) IMPLANT
KIT BASIN OR (CUSTOM PROCEDURE TRAY) ×3 IMPLANT
KIT TURNOVER KIT B (KITS) ×3 IMPLANT
L-HOOK LAP DISP 36CM (ELECTROSURGICAL) ×3
LHOOK LAP DISP 36CM (ELECTROSURGICAL) ×2 IMPLANT
NS IRRIG 1000ML POUR BTL (IV SOLUTION) ×6 IMPLANT
PACK GENERAL/GYN (CUSTOM PROCEDURE TRAY) IMPLANT
PACK LAPAROSCOPIC ABD 0248 (SET/KITS/TRAYS/PACK) ×3 IMPLANT
PAD ARMBOARD 7.5X6 YLW CONV (MISCELLANEOUS) ×6 IMPLANT
PENCIL BUTTON HOLSTER BLD 10FT (ELECTRODE) ×3 IMPLANT
RELOAD 45 VASCULAR/THIN (ENDOMECHANICALS) IMPLANT
RELOAD STAPLER BLUE 60MM (STAPLE) ×2 IMPLANT
RELOAD STAPLER WHITE 60MM (STAPLE) ×2 IMPLANT
SET IRRIG TUBING LAPAROSCOPIC (IRRIGATION / IRRIGATOR) ×3 IMPLANT
SHEARS HARMONIC ACE PLUS 36CM (ENDOMECHANICALS) ×3 IMPLANT
SLEEVE ENDOPATH XCEL 5M (ENDOMECHANICALS) ×6 IMPLANT
SOLUTION ANTI FOG 6CC (MISCELLANEOUS) ×3 IMPLANT
SPECIMEN JAR MEDIUM (MISCELLANEOUS) ×3 IMPLANT
SPECIMEN JAR X LARGE (MISCELLANEOUS) ×3 IMPLANT
SPONGE INTESTINAL PEANUT (DISPOSABLE) IMPLANT
SPONGE LAP 18X18 X RAY DECT (DISPOSABLE) IMPLANT
STAPLE ECHEON FLEX 60 POW ENDO (STAPLE) ×6 IMPLANT
STAPLER RELOAD BLUE 60MM (STAPLE) ×3
STAPLER RELOAD WHITE 60MM (STAPLE) ×3
STAPLER VISISTAT 35W (STAPLE) ×3 IMPLANT
STRIP PERI DRY VERITAS 60 (STAPLE) ×3 IMPLANT
SUCTION POOLE TIP (SUCTIONS) IMPLANT
SUT ETHILON 2 0 FS 18 (SUTURE) ×3 IMPLANT
SUT MNCRL AB 4-0 PS2 18 (SUTURE) ×6 IMPLANT
SUT NOVA 1 T20/GS 25DT (SUTURE) IMPLANT
SUT PDS AB 1 TP1 96 (SUTURE) ×6 IMPLANT
SUT PDS II 0 TP-1 LOOPED 60 (SUTURE) ×6 IMPLANT
SUT SILK 2 0 REEL (SUTURE) IMPLANT
SUT SILK 2 0 SH CR/8 (SUTURE) ×6 IMPLANT
SUT SILK 2 0 TIES 10X30 (SUTURE) ×3 IMPLANT
SUT VIC AB 3-0 SH 8-18 (SUTURE) ×3 IMPLANT
SYS LAPSCP GELPORT 120MM (MISCELLANEOUS) ×3
SYSTEM LAPSCP GELPORT 120MM (MISCELLANEOUS) ×2 IMPLANT
TIP RIGID 35CM EVICEL (HEMOSTASIS) ×3 IMPLANT
TOWEL OR 17X24 6PK STRL BLUE (TOWEL DISPOSABLE) ×3 IMPLANT
TOWEL OR 17X26 10 PK STRL BLUE (TOWEL DISPOSABLE) ×3 IMPLANT
TRAY FOLEY MTR SLVR 14FR STAT (SET/KITS/TRAYS/PACK) IMPLANT
TROCAR 12M 150ML BLUNT (TROCAR) ×3 IMPLANT
TROCAR XCEL NON-BLD 5MMX100MML (ENDOMECHANICALS) ×3 IMPLANT
TUBING INSUFFLATION (TUBING) ×3 IMPLANT

## 2018-01-07 NOTE — Op Note (Signed)
PREOPERATIVE DIAGNOSIS:  Calcified pancreatic tail mass      POSTOPERATIVE DIAGNOSIS:  Same      PROCEDURE:  Hand assisted laparoscopic distal pancreatectomy and splenectomy   SURGEON:  Almond Lint, MD      ASSISTANT:  Phylliss Blakes, M.D.      ANESTHESIA:  General and local.      FINDINGS:  Mass adherent to splenic hilum.      SPECIMEN:  Distal pancreas and spleen to Pathology.      ESTIMATED BLOOD LOSS:  50-100 mL      COMPLICATIONS:  None known.      PROCEDURE:  Patient was identified in the holding area and taken to   operating room where she was placed supine on the operating room table.   General anesthesia was induced.  Foley catheter was placed.  She was   placed in the leaning spleen position.  Her abdomen was prepped and   draped in sterile fashion.  Time-out was performed according to surgical   safety check list.  When all was correct, we continued.    A hand port was placed in the upper midline after infiltrating with local anesthetic. This was approximately 7 cm in length.   Pneumoperitoneum was achieved.   Three 5 mm ports were placed in the LUQ. The patient had some adhesions to the abdominal wall.  They were taken down with the Harmonic.  The lienocolic ligament was   taken down with the Harmonic.  The lesser sac was opened with the   Harmonic and all the adhesions were taken down as well as the short gastric arteries.  Once the stomach was completely off the spleen, the inferior border of the   pancreas was identified and this was ppened up with the Harmonic.  The pancreas and the splenic artery and vein were elevated.     The pancreas was then isolated off  the vessels.  The vessels were then stapled with a vascular load.  The pancreas was stapled with blue load of the Echelon stapler with peristrips.     The posterior attachments were then taken   off with the Harmonic scalpel and the spleen was disconnected from the   posterior attachments.  The specimen was  then removed from the hand   port.   The abdomen was copiously irrigated and there was   no sign of any additional bleeding.  Evicel was placed over the tail of   the pancreas.  The pancreatic specimen was examined and the duct of the   pancreas was seen to be in the staple line.  One of the 5 mm trocars had   to be upsized to a 12 in order to pass the stapler and this fascial   incision was closed with the EndoCatch and a 0 Vicryl.  This was   airtight.  The 19 Blake drain was then passed into the abdomen through   the hand port and pulled out through one of the other 5 mm trocars.   This was placed in the appropriate location.    At this point, the other 5  mm trocar was removed and the fascia from the hand port was closed with running #1 looped PDS sutures.  Exparel was infiltrated into the preperitoneal space of the hand port incision.  The skin of all the incisions was   then closed with 4-0 Monocryl in a subcuticular fashion and then dressed   with benzoin, Steri-Strips, and soft dressings.  The patient tolerated   the procedure well.  She was extubated and taken to the PACU in stable   condition.  Needle, sponge, and instrument counts were correct x2               Almond Lint, MD

## 2018-01-07 NOTE — Anesthesia Procedure Notes (Signed)
Procedure Name: Intubation Date/Time: 01/07/2018 3:17 PM Performed by: Lance Coon, CRNA Pre-anesthesia Checklist: Patient identified, Emergency Drugs available, Suction available, Patient being monitored and Timeout performed Patient Re-evaluated:Patient Re-evaluated prior to induction Oxygen Delivery Method: Circle system utilized Preoxygenation: Pre-oxygenation with 100% oxygen Induction Type: IV induction Ventilation: Mask ventilation without difficulty Laryngoscope Size: Mac and 3 Grade View: Grade I Tube type: Oral Tube size: 7.0 mm Number of attempts: 1 Airway Equipment and Method: Stylet Placement Confirmation: ETT inserted through vocal cords under direct vision,  positive ETCO2 and breath sounds checked- equal and bilateral Secured at: 21 cm Tube secured with: Tape Dental Injury: Teeth and Oropharynx as per pre-operative assessment  Comments: DLx1 inadequate paralysis, DLx1 per DR Smith Robert also inadequater paralysis, 3rd DL pt was relaxed and able to pass ETT

## 2018-01-07 NOTE — Anesthesia Preprocedure Evaluation (Addendum)
Anesthesia Evaluation  Patient identified by MRN, date of birth, ID band Patient awake    Reviewed: Allergy & Precautions, NPO status , Patient's Chart, lab work & pertinent test results  Airway Mallampati: II  TM Distance: >3 FB Neck ROM: Full    Dental  (+) Teeth Intact, Dental Advisory Given   Pulmonary Current Smoker,    breath sounds clear to auscultation       Cardiovascular negative cardio ROS   Rhythm:Regular Rate:Normal     Neuro/Psych negative neurological ROS     GI/Hepatic negative GI ROS, Neg liver ROS,   Endo/Other  negative endocrine ROS  Renal/GU negative Renal ROS     Musculoskeletal negative musculoskeletal ROS (+)   Abdominal Normal abdominal exam  (+)   Peds  Hematology negative hematology ROS (+)   Anesthesia Other Findings   Reproductive/Obstetrics                            Lab Results  Component Value Date   WBC 11.1 (H) 01/02/2018   HGB 14.1 01/02/2018   HCT 42.2 01/02/2018   MCV 94.6 01/02/2018   PLT 235 01/02/2018   Lab Results  Component Value Date   CREATININE 0.75 01/02/2018   BUN 10 01/02/2018   NA 138 01/02/2018   K 4.1 01/02/2018   CL 104 01/02/2018   CO2 26 01/02/2018   Lab Results  Component Value Date   INR 1.01 11/18/2017     Anesthesia Physical Anesthesia Plan  ASA: III  Anesthesia Plan: General   Post-op Pain Management:    Induction: Intravenous  PONV Risk Score and Plan: 3 and Ondansetron, Midazolam and Dexamethasone  Airway Management Planned: Oral ETT  Additional Equipment: Arterial line  Intra-op Plan:   Post-operative Plan: Extubation in OR  Informed Consent: I have reviewed the patients History and Physical, chart, labs and discussed the procedure including the risks, benefits and alternatives for the proposed anesthesia with the patient or authorized representative who has indicated his/her understanding and  acceptance.   Dental advisory given  Plan Discussed with: CRNA  Anesthesia Plan Comments:        Anesthesia Quick Evaluation

## 2018-01-07 NOTE — Anesthesia Procedure Notes (Signed)
Arterial Line Insertion Start/End11/07/2017 1:09 PM, 01/07/2018 1:09 PM Performed by: Shelton Silvas, MD, Hart Robinsons, CRNA, CRNA  Patient location: Pre-op. Preanesthetic checklist: patient identified, IV checked, site marked, risks and benefits discussed, surgical consent, monitors and equipment checked, pre-op evaluation, timeout performed and anesthesia consent Lidocaine 1% used for infiltration Right, radial was placed Catheter size: 20 Fr Hand hygiene performed  and maximum sterile barriers used   Attempts: 1 Procedure performed without using ultrasound guided technique. Following insertion, dressing applied. Post procedure assessment: normal and unchanged  Patient tolerated the procedure well with no immediate complications.

## 2018-01-07 NOTE — Interval H&P Note (Signed)
History and Physical Interval Note:  01/07/2018 12:10 PM  Meredith Cohen  has presented today for surgery, with the diagnosis of Distal pancreatic mass  The various methods of treatment have been discussed with the patient and family. After consideration of risks, benefits and other options for treatment, the patient has consented to  Procedure(s): HAND ASSISTED DISTAL PANCREATECTOMY AND SPLENECTOMY (N/A) SPLENECTOMY (N/A) as a surgical intervention .  The patient's history has been reviewed, patient examined, no change in status, stable for surgery.  I have reviewed the patient's chart and labs.  Questions were answered to the patient's satisfaction.     Almond Lint

## 2018-01-07 NOTE — Anesthesia Postprocedure Evaluation (Signed)
Anesthesia Post Note  Patient: Meredith Cohen  Procedure(s) Performed: HAND ASSISTED DISTAL PANCREATECTOMY AND SPLENECTOMY (N/A Abdomen)     Patient location during evaluation: PACU Anesthesia Type: General Level of consciousness: awake and alert Pain management: pain level controlled Vital Signs Assessment: post-procedure vital signs reviewed and stable Respiratory status: spontaneous breathing, nonlabored ventilation and respiratory function stable Cardiovascular status: blood pressure returned to baseline and stable Postop Assessment: no apparent nausea or vomiting Anesthetic complications: no    Last Vitals:  Vitals:   01/07/18 1739 01/07/18 1754  BP: 131/78 121/64  Pulse: 64 60  Resp: 11 (!) 8  Temp:    SpO2: 98% 97%    Last Pain:  Vitals:   01/07/18 1749  TempSrc:   PainSc: 10-Worst pain ever                 Chrystal Zeimet,W. EDMOND

## 2018-01-07 NOTE — Transfer of Care (Signed)
Immediate Anesthesia Transfer of Care Note  Patient: Meredith Cohen  Procedure(s) Performed: HAND ASSISTED DISTAL PANCREATECTOMY AND SPLENECTOMY (N/A Abdomen)  Patient Location: PACU  Anesthesia Type:General  Level of Consciousness: awake and patient cooperative  Airway & Oxygen Therapy: Patient Spontanous Breathing  Post-op Assessment: Report given to RN and Post -op Vital signs reviewed and stable  Post vital signs: Reviewed and stable  Last Vitals:  Vitals Value Taken Time  BP 121/79 01/07/2018  5:24 PM  Temp 36.2 C 01/07/2018  5:23 PM  Pulse 66 01/07/2018  5:25 PM  Resp 8 01/07/2018  5:25 PM  SpO2 100 % 01/07/2018  5:25 PM  Vitals shown include unvalidated device data.  Last Pain:  Vitals:   01/07/18 1723  TempSrc:   PainSc: Asleep      Patients Stated Pain Goal: 4 (01/07/18 1218)  Complications: No apparent anesthesia complications

## 2018-01-08 ENCOUNTER — Encounter (HOSPITAL_COMMUNITY): Payer: Self-pay | Admitting: General Practice

## 2018-01-08 ENCOUNTER — Other Ambulatory Visit: Payer: Self-pay

## 2018-01-08 LAB — BASIC METABOLIC PANEL
Anion gap: 5 (ref 5–15)
BUN: 9 mg/dL (ref 6–20)
CHLORIDE: 105 mmol/L (ref 98–111)
CO2: 25 mmol/L (ref 22–32)
Calcium: 8.7 mg/dL — ABNORMAL LOW (ref 8.9–10.3)
Creatinine, Ser: 0.67 mg/dL (ref 0.44–1.00)
GFR calc Af Amer: >60 mL/min (ref >60)
GFR calc non Af Amer: >60 mL/min (ref >60)
Glucose, Bld: 197 mg/dL — ABNORMAL HIGH (ref 70–99)
POTASSIUM: 4.5 mmol/L (ref 3.5–5.1)
SODIUM: 135 mmol/L (ref 135–145)

## 2018-01-08 LAB — CBC
HCT: 39.5 % (ref 36.0–46.0)
HEMOGLOBIN: 13.4 g/dL (ref 12.0–15.0)
MCH: 31.9 pg (ref 26.0–34.0)
MCHC: 33.9 g/dL (ref 30.0–36.0)
MCV: 94 fL (ref 80.0–100.0)
Platelets: 220 10*3/uL (ref 150–400)
RBC: 4.2 MIL/uL (ref 3.87–5.11)
RDW: 11.7 % (ref 11.5–15.5)
WBC: 14.8 10*3/uL — ABNORMAL HIGH (ref 4.0–10.5)
nRBC: 0 % (ref 0.0–0.2)

## 2018-01-08 NOTE — Progress Notes (Signed)
1 Day Post-Op   Subjective/Chief Complaint: Used quite a bit of dilaudid over night.  Pain tolerable.   Objective: Vital signs in last 24 hours: Temp:  [97.2 F (36.2 C)-98.1 F (36.7 C)] 98 F (36.7 C) (11/06 0544) Pulse Rate:  [58-77] 77 (11/06 0544) Resp:  [6-22] 16 (11/06 0544) BP: (98-131)/(64-79) 98/71 (11/06 0544) SpO2:  [97 %-100 %] 97 % (11/06 0544) Arterial Line BP: (141)/(74) 141/74 (11/05 1724) FiO2 (%):  [97 %] 97 % (11/06 0343) Weight:  [61.2 kg] 61.2 kg (11/05 1145) Last BM Date: 01/06/18  Intake/Output from previous day: 11/05 0701 - 11/06 0700 In: 2564.6 [P.O.:240; I.V.:2323.7; IV Piggyback:0.9] Out: 2432 [Urine:2330; Drains:2; Blood:100] Intake/Output this shift: No intake/output data recorded.  General appearance: alert, cooperative and mild distress Resp: breathing comfortably GI: soft, mildly distended, dressing c/d/i.  drain serosang Extremities: extremities normal, atraumatic, no cyanosis or edema  Lab Results:  Recent Labs    01/07/18 1902 01/08/18 0205  WBC 12.3* 14.8*  HGB 10.1* 13.4  HCT 30.9* 39.5  PLT 171 220   BMET Recent Labs    01/07/18 1902 01/08/18 0205  NA  --  135  K  --  4.5  CL  --  105  CO2  --  25  GLUCOSE  --  197*  BUN  --  9  CREATININE 0.44 0.67  CALCIUM  --  8.7*   PT/INR No results for input(s): LABPROT, INR in the last 72 hours. ABG No results for input(s): PHART, HCO3 in the last 72 hours.  Invalid input(s): PCO2, PO2  Studies/Results: No results found.  Anti-infectives: Anti-infectives (From admission, onward)   Start     Dose/Rate Route Frequency Ordered Stop   01/08/18 0300  ciprofloxacin (CIPRO) IVPB 400 mg     400 mg 200 mL/hr over 60 Minutes Intravenous Every 12 hours 01/07/18 1815 01/08/18 0334   01/07/18 0930  ciprofloxacin (CIPRO) IVPB 400 mg     400 mg 200 mL/hr over 60 Minutes Intravenous On call to O.R. 01/07/18 0918 01/07/18 1522      Assessment/Plan: s/p Procedure(s): HAND  ASSISTED DISTAL PANCREATECTOMY AND SPLENECTOMY (N/A) d/c foley Advance diet continue PCA.  Ambulate Decrease iv fluids.   LOS: 1 day    Almond Lint 01/08/2018

## 2018-01-09 LAB — CBC
HCT: 35.5 % — ABNORMAL LOW (ref 36.0–46.0)
HEMOGLOBIN: 11.3 g/dL — AB (ref 12.0–15.0)
MCH: 31 pg (ref 26.0–34.0)
MCHC: 31.8 g/dL (ref 30.0–36.0)
MCV: 97.5 fL (ref 80.0–100.0)
Platelets: 230 10*3/uL (ref 150–400)
RBC: 3.64 MIL/uL — AB (ref 3.87–5.11)
RDW: 12.1 % (ref 11.5–15.5)
WBC: 14.1 10*3/uL — ABNORMAL HIGH (ref 4.0–10.5)
nRBC: 0 % (ref 0.0–0.2)

## 2018-01-09 LAB — TYPE AND SCREEN
ABO/RH(D): O POS
ANTIBODY SCREEN: NEGATIVE
UNIT DIVISION: 0
Unit division: 0

## 2018-01-09 LAB — BPAM RBC
BLOOD PRODUCT EXPIRATION DATE: 201912012359
BLOOD PRODUCT EXPIRATION DATE: 201912012359
ISSUE DATE / TIME: 201911012229
ISSUE DATE / TIME: 201911012229
UNIT TYPE AND RH: 5100
Unit Type and Rh: 5100

## 2018-01-09 LAB — BASIC METABOLIC PANEL
Anion gap: 5 (ref 5–15)
BUN: 7 mg/dL (ref 6–20)
CO2: 30 mmol/L (ref 22–32)
CREATININE: 0.75 mg/dL (ref 0.44–1.00)
Calcium: 9.1 mg/dL (ref 8.9–10.3)
Chloride: 108 mmol/L (ref 98–111)
GFR calc Af Amer: >60 mL/min (ref >60)
GFR calc non Af Amer: >60 mL/min (ref >60)
GLUCOSE: 117 mg/dL — AB (ref 70–99)
POTASSIUM: 4.8 mmol/L (ref 3.5–5.1)
Sodium: 143 mmol/L (ref 135–145)

## 2018-01-09 MED ORDER — IBUPROFEN 600 MG PO TABS
600.0000 mg | ORAL_TABLET | Freq: Four times a day (QID) | ORAL | Status: DC | PRN
  Administered 2018-01-09 – 2018-01-10 (×2): 600 mg via ORAL
  Filled 2018-01-09 (×2): qty 1

## 2018-01-09 MED ORDER — NALOXONE HCL 0.4 MG/ML IJ SOLN: 0.4000 mg | INTRAMUSCULAR | Status: DC | PRN

## 2018-01-09 MED ORDER — DIPHENHYDRAMINE HCL 12.5 MG/5ML PO ELIX: 12.5000 mg | ORAL_SOLUTION | Freq: Four times a day (QID) | ORAL | Status: DC | PRN

## 2018-01-09 MED ORDER — BISACODYL 5 MG PO TBEC
5.0000 mg | DELAYED_RELEASE_TABLET | Freq: Every day | ORAL | Status: DC
  Administered 2018-01-09: 5 mg via ORAL
  Filled 2018-01-09: qty 1

## 2018-01-09 MED ORDER — OXYCODONE HCL 5 MG PO TABS
5.0000 mg | ORAL_TABLET | ORAL | Status: DC | PRN
  Administered 2018-01-09: 5 mg via ORAL
  Filled 2018-01-09: qty 1

## 2018-01-09 MED ORDER — HYDROMORPHONE 1 MG/ML IV SOLN
INTRAVENOUS | Status: DC
  Administered 2018-01-09: 2.2 mg via INTRAVENOUS
  Administered 2018-01-09: 3.5 mg via INTRAVENOUS
  Administered 2018-01-10: 25 mg via INTRAVENOUS
  Administered 2018-01-10: 5 mg via INTRAVENOUS
  Filled 2018-01-09: qty 25

## 2018-01-09 MED ORDER — ONDANSETRON HCL 4 MG/2ML IJ SOLN: 4.0000 mg | Freq: Four times a day (QID) | INTRAMUSCULAR | Status: DC | PRN

## 2018-01-09 MED ORDER — POLYETHYLENE GLYCOL 3350 17 G PO PACK
17.0000 g | PACK | Freq: Every day | ORAL | Status: DC
  Administered 2018-01-10 – 2018-01-13 (×2): 17 g via ORAL
  Filled 2018-01-09 (×4): qty 1

## 2018-01-09 MED ORDER — DIPHENHYDRAMINE HCL 50 MG/ML IJ SOLN: 12.5000 mg | Freq: Four times a day (QID) | INTRAMUSCULAR | Status: DC | PRN

## 2018-01-09 MED ORDER — SODIUM CHLORIDE 0.9% FLUSH: 9.0000 mL | INTRAVENOUS | Status: DC | PRN

## 2018-01-09 NOTE — Progress Notes (Signed)
2 Days Post-Op   Subjective/Chief Complaint: C/o headache and constipation.  Passing a small amount of gas.  Denies nausea or significant belching.  Tolerated full liq yesterday.  C/o "rib pain."     Objective: Vital signs in last 24 hours: Temp:  [97.5 F (36.4 C)-98.5 F (36.9 C)] 98.2 F (36.8 C) (11/07 0700) Pulse Rate:  [78-92] 82 (11/07 0700) Resp:  [9-18] 14 (11/07 0516) BP: (87-111)/(56-73) 108/63 (11/07 0700) SpO2:  [96 %-100 %] 96 % (11/07 0700) Last BM Date: 01/06/18  Intake/Output from previous day: 11/06 0701 - 11/07 0700 In: -  Out: 1200 [Urine:1200] Intake/Output this shift: No intake/output data recorded.  General appearance: alert, cooperative and mild distress Resp: breathing comfortably GI: soft, similarly mildly distended, dressing c/d/i.  drain serosang Extremities: extremities normal, atraumatic, no cyanosis or edema  Lab Results:  Recent Labs    01/08/18 0205 01/09/18 0157  WBC 14.8* 14.1*  HGB 13.4 11.3*  HCT 39.5 35.5*  PLT 220 230   BMET Recent Labs    01/08/18 0205 01/09/18 0157  NA 135 143  K 4.5 4.8  CL 105 108  CO2 25 30  GLUCOSE 197* 117*  BUN 9 7  CREATININE 0.67 0.75  CALCIUM 8.7* 9.1   PT/INR No results for input(s): LABPROT, INR in the last 72 hours. ABG No results for input(s): PHART, HCO3 in the last 72 hours.  Invalid input(s): PCO2, PO2  Studies/Results: No results found.  Anti-infectives: Anti-infectives (From admission, onward)   Start     Dose/Rate Route Frequency Ordered Stop   01/08/18 0300  ciprofloxacin (CIPRO) IVPB 400 mg     400 mg 200 mL/hr over 60 Minutes Intravenous Every 12 hours 01/07/18 1815 01/08/18 0334   01/07/18 0930  ciprofloxacin (CIPRO) IVPB 400 mg     400 mg 200 mL/hr over 60 Minutes Intravenous On call to O.R. 01/07/18 0918 01/07/18 1522      Assessment/Plan: s/p Procedure(s): HAND ASSISTED DISTAL PANCREATECTOMY AND SPLENECTOMY (N/A) Change PCA to q12 min interval. Add oral  oxy and ibuprofen (rather than toradol). Ambulate. Limited soft foods with full liquids. Increase bowel regimen Await pathology.     LOS: 2 days    Almond Lint 01/09/2018

## 2018-01-10 LAB — CBC
HCT: 33.5 % — ABNORMAL LOW (ref 36.0–46.0)
Hemoglobin: 10.9 g/dL — ABNORMAL LOW (ref 12.0–15.0)
MCH: 32 pg (ref 26.0–34.0)
MCHC: 32.5 g/dL (ref 30.0–36.0)
MCV: 98.2 fL (ref 80.0–100.0)
NRBC: 0 % (ref 0.0–0.2)
PLATELETS: 235 10*3/uL (ref 150–400)
RBC: 3.41 MIL/uL — ABNORMAL LOW (ref 3.87–5.11)
RDW: 12.3 % (ref 11.5–15.5)
WBC: 12 10*3/uL — AB (ref 4.0–10.5)

## 2018-01-10 LAB — BASIC METABOLIC PANEL
ANION GAP: 7 (ref 5–15)
CO2: 30 mmol/L (ref 22–32)
Calcium: 9 mg/dL (ref 8.9–10.3)
Chloride: 105 mmol/L (ref 98–111)
Creatinine, Ser: 0.65 mg/dL (ref 0.44–1.00)
Glucose, Bld: 108 mg/dL — ABNORMAL HIGH (ref 70–99)
POTASSIUM: 5.3 mmol/L — AB (ref 3.5–5.1)
SODIUM: 142 mmol/L (ref 135–145)

## 2018-01-10 MED ORDER — HYDROMORPHONE 1 MG/ML IV SOLN
INTRAVENOUS | Status: DC
  Administered 2018-01-10: 4 mg via INTRAVENOUS
  Administered 2018-01-10: 6 mg via INTRAVENOUS
  Administered 2018-01-10: 4 mg via INTRAVENOUS
  Administered 2018-01-11: 2.9 mg via INTRAVENOUS
  Administered 2018-01-11: 5 mg via INTRAVENOUS
  Administered 2018-01-11: 3.5 mg via INTRAVENOUS
  Filled 2018-01-10: qty 25

## 2018-01-10 MED ORDER — HYDROMORPHONE HCL 2 MG PO TABS
2.0000 mg | ORAL_TABLET | ORAL | Status: DC | PRN
  Administered 2018-01-11 – 2018-01-13 (×9): 4 mg via ORAL
  Filled 2018-01-10 (×11): qty 2

## 2018-01-10 MED ORDER — MAGNESIUM CITRATE PO SOLN: 0.5000 | Freq: Once | ORAL | Status: DC

## 2018-01-10 MED ORDER — BISACODYL 5 MG PO TBEC
10.0000 mg | DELAYED_RELEASE_TABLET | Freq: Every day | ORAL | Status: DC
  Administered 2018-01-10 – 2018-01-13 (×3): 10 mg via ORAL
  Filled 2018-01-10 (×3): qty 2

## 2018-01-10 NOTE — Progress Notes (Signed)
3 Days Post-Op   Subjective/Chief Complaint: Improved headache.  Still having "tightness."  Oxy not working.  Decreased overall PCA amount yesterday.    Objective: Vital signs in last 24 hours: Temp:  [97.6 F (36.4 C)-98.8 F (37.1 C)] 98.8 F (37.1 C) (11/08 0454) Pulse Rate:  [77-82] 78 (11/08 0454) Resp:  [14-18] 15 (11/08 0757) BP: (101-110)/(57-73) 101/60 (11/08 0454) SpO2:  [93 %-100 %] 97 % (11/08 0757) Last BM Date: 01/06/18  Intake/Output from previous day: 11/07 0701 - 11/08 0700 In: 120 [P.O.:120] Out: 50 [Drains:50] Intake/Output this shift: No intake/output data recorded.  General appearance: alert, cooperative and mild distress Resp: breathing comfortably GI: soft, similarly mildly distended, dressing c/d/i.  drain serosang Extremities: extremities normal, atraumatic, no cyanosis or edema  Lab Results:  Recent Labs    01/09/18 0157 01/10/18 0217  WBC 14.1* 12.0*  HGB 11.3* 10.9*  HCT 35.5* 33.5*  PLT 230 235   BMET Recent Labs    01/09/18 0157 01/10/18 0217  NA 143 142  K 4.8 5.3*  CL 108 105  CO2 30 30  GLUCOSE 117* 108*  BUN 7 <5*  CREATININE 0.75 0.65  CALCIUM 9.1 9.0   PT/INR No results for input(s): LABPROT, INR in the last 72 hours. ABG No results for input(s): PHART, HCO3 in the last 72 hours.  Invalid input(s): PCO2, PO2  Studies/Results: No results found.  Anti-infectives: Anti-infectives (From admission, onward)   Start     Dose/Rate Route Frequency Ordered Stop   01/08/18 0300  ciprofloxacin (CIPRO) IVPB 400 mg     400 mg 200 mL/hr over 60 Minutes Intravenous Every 12 hours 01/07/18 1815 01/08/18 0334   01/07/18 0930  ciprofloxacin (CIPRO) IVPB 400 mg     400 mg 200 mL/hr over 60 Minutes Intravenous On call to O.R. 01/07/18 0918 01/07/18 1522      Assessment/Plan: s/p Procedure(s): HAND ASSISTED DISTAL PANCREATECTOMY AND SPLENECTOMY (N/A) Change PCA to q15 min interval. Switch to oral dilaudid.    Ambulate. Limited soft foods with full liquids. Increase bowel regimen. Pathology benign!   LOS: 3 days    Almond Lint 01/10/2018

## 2018-01-10 NOTE — Care Management Note (Signed)
Case Management Note  Patient Details  Name: Meredith Cohen MRN: [id number] Date of Birth: December 23, 1970  Subjective/Objective:                    Action/Plan: Patient has PCP. Awaiting discharge prescriptions , see if eligible for MATCH.   Expected Discharge Date:                  Expected Discharge Plan:  Home/Self Care  In-House Referral:  Financial Counselor  Discharge planning Services  CM Consult, MATCH Program, Medication Assistance  Post Acute Care Choice:  NA Choice offered to:     DME Arranged:    DME Agency:     HH Arranged:    HH Agency:     Status of Service:  In process, will continue to follow  If discussed at Long Length of Stay Meetings, dates discussed:    Additional Comments:  Kingsley Plan, RN 01/10/2018, 10:09 AM

## 2018-01-10 NOTE — Plan of Care (Signed)
  Problem: Education: Goal: Knowledge of General Education information will improve Description Including pain rating scale, medication(s)/side effects and non-pharmacologic comfort measures Outcome: Progressing   Problem: Health Behavior/Discharge Planning: Goal: Ability to manage health-related needs will improve Outcome: Progressing   Problem: Clinical Measurements: Goal: Ability to maintain clinical measurements within normal limits will improve Outcome: Progressing Goal: Will remain free from infection Outcome: Progressing Goal: Respiratory complications will improve Outcome: Progressing Goal: Cardiovascular complication will be avoided Outcome: Progressing   Problem: Activity: Goal: Risk for activity intolerance will decrease Outcome: Progressing   Problem: Nutrition: Goal: Adequate nutrition will be maintained Outcome: Progressing   Problem: Coping: Goal: Level of anxiety will decrease Outcome: Progressing   Problem: Elimination: Goal: Will not experience complications related to urinary retention Outcome: Progressing   Problem: Pain Managment: Goal: General experience of comfort will improve Outcome: Progressing   Problem: Safety: Goal: Ability to remain free from injury will improve Outcome: Progressing   Problem: Skin Integrity: Goal: Risk for impaired skin integrity will decrease Outcome: Progressing  Sonny Masters, RN 01/10/2018

## 2018-01-11 LAB — BASIC METABOLIC PANEL
Anion gap: 8 (ref 5–15)
BUN: 6 mg/dL (ref 6–20)
CALCIUM: 8.7 mg/dL — AB (ref 8.9–10.3)
CO2: 29 mmol/L (ref 22–32)
CREATININE: 0.8 mg/dL (ref 0.44–1.00)
Chloride: 103 mmol/L (ref 98–111)
GFR calc Af Amer: >60 mL/min (ref >60)
Glucose, Bld: 107 mg/dL — ABNORMAL HIGH (ref 70–99)
Potassium: 4.4 mmol/L (ref 3.5–5.1)
SODIUM: 140 mmol/L (ref 135–145)

## 2018-01-11 LAB — CBC
HCT: 35.4 % — ABNORMAL LOW (ref 36.0–46.0)
Hemoglobin: 11 g/dL — ABNORMAL LOW (ref 12.0–15.0)
MCH: 30.6 pg (ref 26.0–34.0)
MCHC: 31.1 g/dL (ref 30.0–36.0)
MCV: 98.3 fL (ref 80.0–100.0)
PLATELETS: 252 10*3/uL (ref 150–400)
RBC: 3.6 MIL/uL — AB (ref 3.87–5.11)
RDW: 12.1 % (ref 11.5–15.5)
WBC: 11.5 10*3/uL — ABNORMAL HIGH (ref 4.0–10.5)
nRBC: 0 % (ref 0.0–0.2)

## 2018-01-11 MED ORDER — HYDROMORPHONE HCL 1 MG/ML IJ SOLN
0.5000 mg | INTRAMUSCULAR | Status: DC | PRN
  Administered 2018-01-11: 0.5 mg via INTRAVENOUS
  Administered 2018-01-11: 2 mg via INTRAVENOUS
  Administered 2018-01-11: 1 mg via INTRAVENOUS
  Administered 2018-01-12 (×4): 2 mg via INTRAVENOUS
  Administered 2018-01-13: 1 mg via INTRAVENOUS
  Administered 2018-01-13: 2 mg via INTRAVENOUS
  Filled 2018-01-11 (×4): qty 2
  Filled 2018-01-11 (×2): qty 1
  Filled 2018-01-11: qty 2
  Filled 2018-01-11: qty 1
  Filled 2018-01-11: qty 2

## 2018-01-11 NOTE — Progress Notes (Signed)
Pt. Had an advantageous day. Able to ambulate independently. Pt. Alert, cooperative, and very cheerful.   Dineen Kid, Student-RN 01/11/18

## 2018-01-11 NOTE — Plan of Care (Signed)
  Problem: Education: Goal: Knowledge of General Education information will improve Description Including pain rating scale, medication(s)/side effects and non-pharmacologic comfort measures Outcome: Progressing   Problem: Health Behavior/Discharge Planning: Goal: Ability to manage health-related needs will improve Outcome: Progressing   Problem: Clinical Measurements: Goal: Ability to maintain clinical measurements within normal limits will improve Outcome: Progressing Goal: Will remain free from infection Outcome: Progressing Goal: Respiratory complications will improve Outcome: Progressing Goal: Cardiovascular complication will be avoided Outcome: Progressing   Problem: Activity: Goal: Risk for activity intolerance will decrease Outcome: Progressing   Problem: Nutrition: Goal: Adequate nutrition will be maintained Outcome: Progressing   Problem: Coping: Goal: Level of anxiety will decrease Outcome: Progressing   Problem: Elimination: Goal: Will not experience complications related to bowel motility Outcome: Progressing Goal: Will not experience complications related to urinary retention Outcome: Progressing   Problem: Pain Managment: Goal: General experience of comfort will improve Outcome: Progressing   Problem: Safety: Goal: Ability to remain free from injury will improve Outcome: Progressing   Problem: Skin Integrity: Goal: Risk for impaired skin integrity will decrease Outcome: Progressing  Sonny Masters, RN 01/11/2018

## 2018-01-11 NOTE — Progress Notes (Signed)
4 Days Post-Op   Subjective/Chief Complaint: Started period.  Less tightness since she had small BM yesterday.  Drain output low.  Path benign.  Have backed off on PCA dosing.    Objective: Vital signs in last 24 hours: Temp:  [97.8 F (36.6 C)-98.6 F (37 C)] 98.6 F (37 C) (11/09 0433) Pulse Rate:  [81-111] 81 (11/09 0433) Resp:  [14-18] 18 (11/09 0802) BP: (94-112)/(49-62) 112/60 (11/09 0433) SpO2:  [93 %-99 %] 98 % (11/09 0802) Last BM Date: 01/10/18  Intake/Output from previous day: 11/08 0701 - 11/09 0700 In: 53.8 [I.V.:53.8] Out: 20 [Drains:20] Intake/Output this shift: No intake/output data recorded.  General appearance: alert, cooperative and mild distress Resp: breathing comfortably GI: soft, similarly mildly distended, dressing c/d/i.  drain serosang Extremities: extremities normal, atraumatic, no cyanosis or edema  Lab Results:  Recent Labs    01/10/18 0217 01/11/18 0428  WBC 12.0* 11.5*  HGB 10.9* 11.0*  HCT 33.5* 35.4*  PLT 235 252   BMET Recent Labs    01/10/18 0217 01/11/18 0428  NA 142 140  K 5.3* 4.4  CL 105 103  CO2 30 29  GLUCOSE 108* 107*  BUN <5* 6  CREATININE 0.65 0.80  CALCIUM 9.0 8.7*   PT/INR No results for input(s): LABPROT, INR in the last 72 hours. ABG No results for input(s): PHART, HCO3 in the last 72 hours.  Invalid input(s): PCO2, PO2  Studies/Results: No results found.  Anti-infectives: Anti-infectives (From admission, onward)   Start     Dose/Rate Route Frequency Ordered Stop   01/08/18 0300  ciprofloxacin (CIPRO) IVPB 400 mg     400 mg 200 mL/hr over 60 Minutes Intravenous Every 12 hours 01/07/18 1815 01/08/18 0334   01/07/18 0930  ciprofloxacin (CIPRO) IVPB 400 mg     400 mg 200 mL/hr over 60 Minutes Intravenous On call to O.R. 01/07/18 0918 01/07/18 1522      Assessment/Plan: s/p Procedure(s): HAND ASSISTED DISTAL PANCREATECTOMY AND SPLENECTOMY (N/A) D/c PCA. Oral dilaudid.   Ambulate. Regular  diet. Mag citrate today if needed. Pathology benign Anticipate d/c tomorrow if we can get by without IV pain meds. Drain out later today or tomorrow.     LOS: 4 days    Almond Lint 01/11/2018

## 2018-01-11 NOTE — Progress Notes (Signed)
Dilaudid PCA discontinued. Wasted 16 mg witnessed by Sharyn Dross, RN.

## 2018-01-12 LAB — CBC
HCT: 36.2 % (ref 36.0–46.0)
Hemoglobin: 12 g/dL (ref 12.0–15.0)
MCH: 31.5 pg (ref 26.0–34.0)
MCHC: 33.1 g/dL (ref 30.0–36.0)
MCV: 95 fL (ref 80.0–100.0)
PLATELETS: 350 10*3/uL (ref 150–400)
RBC: 3.81 MIL/uL — AB (ref 3.87–5.11)
RDW: 12 % (ref 11.5–15.5)
WBC: 11.8 10*3/uL — ABNORMAL HIGH (ref 4.0–10.5)
nRBC: 0 % (ref 0.0–0.2)

## 2018-01-12 LAB — BASIC METABOLIC PANEL
Anion gap: 9 (ref 5–15)
BUN: <5 mg/dL — ABNORMAL LOW (ref 6–20)
CALCIUM: 8.7 mg/dL — AB (ref 8.9–10.3)
CO2: 28 mmol/L (ref 22–32)
CREATININE: 0.68 mg/dL (ref 0.44–1.00)
Chloride: 99 mmol/L (ref 98–111)
GFR calc Af Amer: >60 mL/min (ref >60)
GFR calc non Af Amer: >60 mL/min (ref >60)
Glucose, Bld: 101 mg/dL — ABNORMAL HIGH (ref 70–99)
Potassium: 3.9 mmol/L (ref 3.5–5.1)
Sodium: 136 mmol/L (ref 135–145)

## 2018-01-12 LAB — LIPASE, BLOOD: Lipase: 23 U/L (ref 11–51)

## 2018-01-12 NOTE — Progress Notes (Signed)
5 Days Post-Op   Subjective/Chief Complaint: Increased pain today.  Not surprising since d/c'd PCA yesterday, however had temp 99.6 and HR 113.  Also had multiple BMs yesterday.    Objective: Vital signs in last 24 hours: Temp:  [97.4 F (36.3 C)-99.6 F (37.6 C)] 98.3 F (36.8 C) (11/10 0612) Pulse Rate:  [95-113] 113 (11/10 0612) Resp:  [16-18] 16 (11/10 0612) BP: (107-128)/(61-80) 116/80 (11/10 0612) SpO2:  [93 %-99 %] 93 % (11/10 0612) Last BM Date: 01/12/18  Intake/Output from previous day: 11/09 0701 - 11/10 0700 In: 960 [P.O.:960] Out: -  Intake/Output this shift: No intake/output data recorded.  General appearance: alert, cooperative and mild distress Resp: breathing comfortably GI: soft, similarly mildly distended, incision without erythema or drainage.   Extremities: extremities normal, atraumatic, no cyanosis or edema  Lab Results:  Recent Labs    01/10/18 0217 01/11/18 0428  WBC 12.0* 11.5*  HGB 10.9* 11.0*  HCT 33.5* 35.4*  PLT 235 252   BMET Recent Labs    01/10/18 0217 01/11/18 0428  NA 142 140  K 5.3* 4.4  CL 105 103  CO2 30 29  GLUCOSE 108* 107*  BUN <5* 6  CREATININE 0.65 0.80  CALCIUM 9.0 8.7*   PT/INR No results for input(s): LABPROT, INR in the last 72 hours. ABG No results for input(s): PHART, HCO3 in the last 72 hours.  Invalid input(s): PCO2, PO2  Studies/Results: No results found.  Anti-infectives: Anti-infectives (From admission, onward)   Start     Dose/Rate Route Frequency Ordered Stop   01/08/18 0300  ciprofloxacin (CIPRO) IVPB 400 mg     400 mg 200 mL/hr over 60 Minutes Intravenous Every 12 hours 01/07/18 1815 01/08/18 0334   01/07/18 0930  ciprofloxacin (CIPRO) IVPB 400 mg     400 mg 200 mL/hr over 60 Minutes Intravenous On call to O.R. 01/07/18 0918 01/07/18 1522      Assessment/Plan: s/p Procedure(s): HAND ASSISTED DISTAL PANCREATECTOMY AND SPLENECTOMY (N/A). Oral dilaudid with IV breakthrough. Needing  breakthrough.  Check labs today given increased temp.  May be from atelectasis, but if WBCs up, might need additional workup.   Ambulate. Regular diet. Pathology benign Anticipate d/c tomorrow if we can get by without IV pain meds. Drain out   LOS: 5 days    Almond Lint 01/12/2018

## 2018-01-13 MED ORDER — GABAPENTIN 300 MG PO CAPS: 300.0000 mg | ORAL_CAPSULE | Freq: Two times a day (BID) | ORAL | 0 refills | Status: DC

## 2018-01-13 MED ORDER — HYDROMORPHONE HCL 2 MG PO TABS: 2.0000 mg | ORAL_TABLET | ORAL | 0 refills | Status: AC | PRN

## 2018-01-13 MED ORDER — METHOCARBAMOL 500 MG PO TABS: 500.0000 mg | ORAL_TABLET | Freq: Four times a day (QID) | ORAL | 1 refills | Status: DC | PRN

## 2018-01-13 MED ORDER — ACETAMINOPHEN 325 MG PO TABS: 650.0000 mg | ORAL_TABLET | Freq: Four times a day (QID) | ORAL | 0 refills | Status: AC | PRN

## 2018-01-13 NOTE — Care Management Note (Signed)
Case Management Note  Patient Details  Name: Meredith Cohen MRN: [id number] Date of Birth: 01/11/1971  Subjective/Objective:                    Action/Plan: Pt discharging home with self care. Pt states her boyfriend will provide transportation home and supervision at home.  CM provided pt with MATCH letter to assist with the cost of her d/c medications.   Expected Discharge Date:  01/13/18               Expected Discharge Plan:  Home/Self Care  In-House Referral:  Financial Counselor  Discharge planning Services  CM Consult, MATCH Program, Medication Assistance  Post Acute Care Choice:  NA Choice offered to:     DME Arranged:    DME Agency:     HH Arranged:    HH Agency:     Status of Service:  Completed, signed off  If discussed at [employer], dates discussed:    Additional Comments:  Kermit Balo, RN 01/13/2018, 11:37 AM

## 2018-01-13 NOTE — Plan of Care (Signed)

## 2018-01-13 NOTE — Plan of Care (Signed)
  Problem: Elimination: Goal: Will not experience complications related to bowel motility Outcome: Progressing Goal: Will not experience complications related to urinary retention Outcome: Progressing   Problem: Safety: Goal: Ability to remain free from injury will improve Outcome: Progressing   Problem: Skin Integrity: Goal: Risk for impaired skin integrity will decrease Outcome: Progressing   

## 2018-01-13 NOTE — Discharge Instructions (Signed)
[facility]      [facility] °[phone] ° °ABDOMINAL SURGERY: POST OP INSTRUCTIONS ° °Always review your discharge instruction sheet given to you by the facility where your surgery was performed. ° °IF YOU HAVE DISABILITY OR FAMILY LEAVE FORMS, YOU MUST BRING THEM TO THE OFFICE FOR PROCESSING.  PLEASE DO NOT GIVE THEM TO YOUR DOCTOR. ° °1. A prescription for pain medication may be given to you upon discharge.  Take your pain medication as prescribed, if needed.  If narcotic pain medicine is not needed, then you may take acetaminophen (Tylenol) or ibuprofen (Advil) as needed. °2. Take your usually prescribed medications unless otherwise directed. °3. If you need a refill on your pain medication, please contact your pharmacy. They will contact our office to request authorization.  Prescriptions will not be filled after 5pm or on week-ends. °4. You should follow a light diet the first few days after arrival home, such as soup and crackers, pudding, etc.unless your doctor has advised otherwise. A high-fiber, low fat diet can be resumed as tolerated.   Be sure to include lots of fluids daily. Most patients will experience some swelling and bruising on the chest and neck area.  Ice packs will help.  Swelling and bruising can take several days to resolve °5. Most patients will experience some swelling and bruising in the area of the incision. Ice pack will help. Swelling and bruising can take several days to resolve..  °6. It is common to experience some constipation if taking pain medication after surgery.  Increasing fluid intake and taking a stool softener will usually help or prevent this problem from occurring.  A mild laxative (Milk of Magnesia or Miralax) should be taken according to package directions if there are no bowel movements after 48 hours. °7.  You may have steri-strips (small skin tapes) in place directly over the incision.  These strips should be left on the skin for 10-14 days.  If your  surgeon used skin glue on the incision, you may shower in 48 hours.  The glue will flake off over the next 2-3 weeks.  Any sutures or staples will be removed at the office during your follow-up visit. You may find that a light gauze bandage over your incision may keep your staples from being rubbed or pulled. You may shower and replace the bandage daily. °8. ACTIVITIES:  You may resume regular (light) daily activities beginning the next day--such as daily self-care, walking, climbing stairs--gradually increasing activities as tolerated.  You may have sexual intercourse when it is comfortable.  Refrain from any heavy lifting or straining until approved by your doctor. °a. You may drive when you no longer are taking prescription pain medication, you can comfortably wear a seatbelt, and you can safely maneuver your car and apply brakes °b. Return to Work: __________8 weeks if applicable_________________________ °9. You should see your doctor in the office for a follow-up appointment approximately two weeks after your surgery.  Make sure that you call for this appointment within a day or two after you arrive home to insure a convenient appointment time. °OTHER INSTRUCTIONS:  °_____________________________________________________________ °_____________________________________________________________ ° °WHEN TO CALL YOUR DOCTOR: °1. Fever over 101.0 °2. Inability to urinate °3. Nausea and/or vomiting °4. Extreme swelling or bruising °5. Continued bleeding from incision. °6. Increased pain, redness, or drainage from the incision. °7. Difficulty swallowing or breathing °8. Muscle cramping or spasms. °9. Numbness or tingling in hands or feet or around lips. ° °The clinic staff is   available to answer your questions during regular business hours.  Please don’t hesitate to call and ask to speak to one of the nurses if you have concerns. ° °For further questions, please visit [email or web address] ° ° ° °

## 2018-01-13 NOTE — Discharge Summary (Signed)
Physician Discharge Summary  Patient ID: Meredith Cohen MRN: [id number] DOB/AGE: 1970/11/20 47 y.o.  Admit date: 01/07/2018 Discharge date: 01/13/2018  Admission Diagnoses: Patient Active Problem List   Diagnosis Date Noted  . Pancreatic mass   . 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  She had significant pain which was not surprising given her pre op pain.  She was on a dilaudid PCA.  This was adjusted to a custom dose in order to provide her with good pain control.  She did not have any nausea and started passing gas on POD 2.  We were able to advance her diet.   She was not able to transition to oxycodone, but we were able to do oral dilaudid, neurontin, and robaxin.  Drain was able to removed prior to discharge.    Consults: None  Significant Diagnostic Studies: labs: prior to d/c WBCs down to 11.8, Cr 0.68  Treatments: surgery: see above  Discharge Exam: Blood pressure (!) 93/56, pulse 98, temperature 98.6 F (37 C), temperature source Oral, resp. rate 20, height 5\' 6"  (1.676 m), weight 61.2 kg, SpO2 96 %. General appearance: alert, cooperative and no distress Resp: breathing comfortably GI: soft, non distended, approp tender at incision.  no significant drainage from drain site.    Disposition: Discharge disposition: 01-Home or Self Care       Discharge Instructions    Call MD for:  difficulty breathing, headache or visual disturbances   Complete by:  As directed    Call MD for:  persistant dizziness or light-headedness   Complete by:  As directed    Call MD for:  persistant nausea and vomiting   Complete by:  As directed    Call MD for:  redness, tenderness, or signs of infection (pain, swelling, redness, odor or  green/yellow discharge around incision site)   Complete by:  As directed    Call MD for:  severe uncontrolled pain   Complete by:  As directed    Call MD for:  temperature >100.4   Complete by:  As directed    Diet - low sodium heart healthy   Complete by:  As directed    Increase activity slowly   Complete by:  As directed      Allergies as of 01/13/2018      Reactions   Adhesive [tape] Rash   bandaids - skin welts   Penicillins Itching   Has patient had a PCN reaction causing immediate rash, facial/tongue/throat swelling, SOB or lightheadedness with hypotension: No Has patient had a PCN reaction causing severe rash involving mucus membranes or skin necrosis: No Has patient had a PCN reaction that required hospitalization: No Has patient had a PCN reaction occurring within the last 10 years: No If all of the above answers are "NO", then may proceed with Cephalosporin use.      Medication List    STOP taking these medications   ALKA-SELTZER PLUS COLD & FLU PO   oxyCODONE-acetaminophen 5-325 MG tablet Commonly known as:  PERCOCET/ROXICET   oxymetazoline 0.05 % nasal spray Commonly known as:  AFRIN     TAKE these medications   acetaminophen 325 MG tablet Commonly known as:  TYLENOL Take 2 tablets (650 mg total) by mouth every 6 (six) hours as needed  for mild pain (or Fever >/= 101).   gabapentin 300 MG capsule Commonly known as:  NEURONTIN Take 1 capsule (300 mg total) by mouth 2 (two) times daily.   HYDROmorphone 2 MG tablet Commonly known as:  DILAUDID Take 1-2 tablets (2-4 mg total) by mouth every 4 (four) hours as needed for moderate pain or severe pain.   ibuprofen 200 MG tablet Commonly known as:  ADVIL,MOTRIN Take 600 mg by mouth daily as needed for moderate pain.   lidocaine 5 % Commonly known as:  LIDODERM Place 1 patch onto the skin daily. Remove & Discard patch within 12 hours or as directed by MD   methocarbamol 500 MG tablet Commonly known as:   ROBAXIN Take 1 tablet (500 mg total) by mouth every 6 (six) hours as needed for muscle spasms.   Milk Thistle 150 MG Caps Take 450 mg by mouth daily.   multivitamin with minerals Tabs tablet Take 1 tablet by mouth daily.   OMEGA 3 PO Take 1,250 mg by mouth daily.   ondansetron 4 MG tablet Commonly known as:  ZOFRAN Take 1 tablet (4 mg total) by mouth every 6 (six) hours. What changed:    when to take this  reasons to take this   OVER THE COUNTER MEDICATION Take 1 capsule by mouth 4 (four) times a week. Hemp oil   SUPER B COMPLEX/C Caps Take 1 tablet by mouth daily.   TIGER BALM MUSCLE RUB EX Apply 1 application topically 2 (two) times daily as needed (pain).      Follow-up Information    Almond Lint, MD Follow up in 2 week(s).   Specialty:  General Surgery Contact information: [address] [place] [address] [phone]           Signed: Almond Lint 01/13/2018, 10:01 AM

## 2018-01-13 NOTE — Progress Notes (Signed)
Maris Berger to be D/C'd  per MD order. Discussed with the patient and all questions fully answered.  VSS, Skin clean, dry and intact without evidence of skin break down, no evidence of skin tears noted.  IV catheter discontinued intact. Site without signs and symptoms of complications. Dressing and pressure applied.  An After Visit Summary was printed and given to the patient. Patient received prescription.  D/c education completed with patient/family including follow up instructions, medication list, d/c activities limitations if indicated, with other d/c instructions as indicated by MD - patient able to verbalize understanding, all questions fully answered.   Patient instructed to return to ED, call 911, or call MD for any changes in condition.   Patient to be escorted via WC, and D/C home via private auto.

## 2018-01-27 ENCOUNTER — Other Ambulatory Visit: Payer: Self-pay

## 2018-01-27 NOTE — Telephone Encounter (Signed)
ondansetron (ZOFRAN) 4 MG tablet   Refill request @  [facility] [phone]#[id number] Ginette Otto- [place], [place] - [address] [facility] [address] [phone] (Phone) [phone] (Fax)

## 2018-01-28 NOTE — Telephone Encounter (Signed)
Pt would like a call back about her pain medications.

## 2018-01-29 MED ORDER — ONDANSETRON HCL 4 MG PO TABS: 4.0000 mg | ORAL_TABLET | Freq: Four times a day (QID) | ORAL | 0 refills | Status: AC

## 2018-02-13 ENCOUNTER — Ambulatory Visit: Payer: Self-pay

## 2018-04-29 ENCOUNTER — Encounter: Payer: Self-pay | Admitting: Internal Medicine

## 2018-10-14 ENCOUNTER — Emergency Department (HOSPITAL_COMMUNITY): Payer: Self-pay

## 2018-10-14 ENCOUNTER — Encounter (HOSPITAL_COMMUNITY): Payer: Self-pay | Admitting: Emergency Medicine

## 2018-10-14 ENCOUNTER — Other Ambulatory Visit: Payer: Self-pay

## 2018-10-14 ENCOUNTER — Inpatient Hospital Stay (HOSPITAL_COMMUNITY)
Admission: EM | Admit: 2018-10-14 | Discharge: 2018-10-17 | DRG: 871 | Disposition: A | Discharge status: Home / Self Care | Payer: Self-pay | Source: Ambulatory Visit | Attending: Internal Medicine | Admitting: Internal Medicine

## 2018-10-14 DIAGNOSIS — N179 Acute kidney failure, unspecified: Secondary | ICD-10-CM | POA: Diagnosis present

## 2018-10-14 DIAGNOSIS — A4151 Sepsis due to Escherichia coli [E. coli]: Principal | ICD-10-CM | POA: Diagnosis present

## 2018-10-14 DIAGNOSIS — Z79899 Other long term (current) drug therapy: Secondary | ICD-10-CM

## 2018-10-14 DIAGNOSIS — D72829 Elevated white blood cell count, unspecified: Secondary | ICD-10-CM | POA: Diagnosis present

## 2018-10-14 DIAGNOSIS — I81 Portal vein thrombosis: Secondary | ICD-10-CM

## 2018-10-14 DIAGNOSIS — K7030 Alcoholic cirrhosis of liver without ascites: Secondary | ICD-10-CM | POA: Diagnosis present

## 2018-10-14 DIAGNOSIS — N170 Acute kidney failure with tubular necrosis: Secondary | ICD-10-CM | POA: Diagnosis present

## 2018-10-14 DIAGNOSIS — N83209 Unspecified ovarian cyst, unspecified side: Secondary | ICD-10-CM | POA: Diagnosis present

## 2018-10-14 DIAGNOSIS — Z86718 Personal history of other venous thrombosis and embolism: Secondary | ICD-10-CM

## 2018-10-14 DIAGNOSIS — Z91048 Other nonmedicinal substance allergy status: Secondary | ICD-10-CM

## 2018-10-14 DIAGNOSIS — R197 Diarrhea, unspecified: Secondary | ICD-10-CM | POA: Diagnosis present

## 2018-10-14 DIAGNOSIS — J019 Acute sinusitis, unspecified: Secondary | ICD-10-CM | POA: Diagnosis present

## 2018-10-14 DIAGNOSIS — R319 Hematuria, unspecified: Secondary | ICD-10-CM

## 2018-10-14 DIAGNOSIS — F1010 Alcohol abuse, uncomplicated: Secondary | ICD-10-CM | POA: Diagnosis present

## 2018-10-14 DIAGNOSIS — Z90411 Acquired partial absence of pancreas: Secondary | ICD-10-CM

## 2018-10-14 DIAGNOSIS — R7401 Elevation of levels of liver transaminase levels: Secondary | ICD-10-CM | POA: Diagnosis present

## 2018-10-14 DIAGNOSIS — R778 Other specified abnormalities of plasma proteins: Secondary | ICD-10-CM | POA: Diagnosis present

## 2018-10-14 DIAGNOSIS — I959 Hypotension, unspecified: Secondary | ICD-10-CM | POA: Diagnosis present

## 2018-10-14 DIAGNOSIS — J329 Chronic sinusitis, unspecified: Secondary | ICD-10-CM | POA: Diagnosis present

## 2018-10-14 DIAGNOSIS — Z20828 Contact with and (suspected) exposure to other viral communicable diseases: Secondary | ICD-10-CM | POA: Diagnosis present

## 2018-10-14 DIAGNOSIS — R112 Nausea with vomiting, unspecified: Secondary | ICD-10-CM | POA: Diagnosis present

## 2018-10-14 DIAGNOSIS — E860 Dehydration: Secondary | ICD-10-CM | POA: Diagnosis present

## 2018-10-14 DIAGNOSIS — E876 Hypokalemia: Secondary | ICD-10-CM | POA: Diagnosis present

## 2018-10-14 DIAGNOSIS — Z9081 Acquired absence of spleen: Secondary | ICD-10-CM

## 2018-10-14 DIAGNOSIS — K7460 Unspecified cirrhosis of liver: Secondary | ICD-10-CM | POA: Diagnosis present

## 2018-10-14 DIAGNOSIS — J342 Deviated nasal septum: Secondary | ICD-10-CM | POA: Diagnosis present

## 2018-10-14 DIAGNOSIS — Z880 Allergy status to penicillin: Secondary | ICD-10-CM

## 2018-10-14 DIAGNOSIS — R3129 Other microscopic hematuria: Secondary | ICD-10-CM | POA: Diagnosis present

## 2018-10-14 DIAGNOSIS — G43909 Migraine, unspecified, not intractable, without status migrainosus: Secondary | ICD-10-CM | POA: Diagnosis present

## 2018-10-14 DIAGNOSIS — F329 Major depressive disorder, single episode, unspecified: Secondary | ICD-10-CM | POA: Diagnosis present

## 2018-10-14 DIAGNOSIS — F17210 Nicotine dependence, cigarettes, uncomplicated: Secondary | ICD-10-CM | POA: Diagnosis present

## 2018-10-14 DIAGNOSIS — R7989 Other specified abnormal findings of blood chemistry: Secondary | ICD-10-CM | POA: Diagnosis present

## 2018-10-14 LAB — URINALYSIS, ROUTINE W REFLEX MICROSCOPIC
Bilirubin Urine: NEGATIVE
Glucose, UA: NEGATIVE mg/dL
Ketones, ur: NEGATIVE mg/dL
Nitrite: NEGATIVE
Protein, ur: >=300 mg/dL — AB
Specific Gravity, Urine: 1.019 (ref 1.005–1.030)
Squamous Epithelial / LPF: >50 — ABNORMAL HIGH (ref 0–5)
WBC, UA: >50 WBC/hpf — ABNORMAL HIGH (ref 0–5)
pH: 5 (ref 5.0–8.0)

## 2018-10-14 LAB — BASIC METABOLIC PANEL
Anion gap: 15 (ref 5–15)
BUN: 18 mg/dL (ref 6–20)
CO2: 21 mmol/L — ABNORMAL LOW (ref 22–32)
Calcium: 7.6 mg/dL — ABNORMAL LOW (ref 8.9–10.3)
Chloride: 98 mmol/L (ref 98–111)
Creatinine, Ser: 2.2 mg/dL — ABNORMAL HIGH (ref 0.44–1.00)
GFR calc Af Amer: 30 mL/min — ABNORMAL LOW (ref >60)
GFR calc non Af Amer: 26 mL/min — ABNORMAL LOW (ref >60)
Glucose, Bld: 129 mg/dL — ABNORMAL HIGH (ref 70–99)
Potassium: 3.3 mmol/L — ABNORMAL LOW (ref 3.5–5.1)
Sodium: 134 mmol/L — ABNORMAL LOW (ref 135–145)

## 2018-10-14 LAB — CBC
HCT: 37.5 % (ref 36.0–46.0)
Hemoglobin: 13.1 g/dL (ref 12.0–15.0)
MCH: 34.7 pg — ABNORMAL HIGH (ref 26.0–34.0)
MCHC: 34.9 g/dL (ref 30.0–36.0)
MCV: 99.2 fL (ref 80.0–100.0)
Platelets: 120 10*3/uL — ABNORMAL LOW (ref 150–400)
RBC: 3.78 MIL/uL — ABNORMAL LOW (ref 3.87–5.11)
RDW: 15.8 % — ABNORMAL HIGH (ref 11.5–15.5)
WBC: 15.8 10*3/uL — ABNORMAL HIGH (ref 4.0–10.5)
nRBC: 0 % (ref 0.0–0.2)

## 2018-10-14 LAB — CBG MONITORING, ED: Glucose-Capillary: 115 mg/dL — ABNORMAL HIGH (ref 70–99)

## 2018-10-14 LAB — D-DIMER, QUANTITATIVE: D-Dimer, Quant: 4.33 ug/mL-FEU — ABNORMAL HIGH (ref 0.00–0.50)

## 2018-10-14 LAB — DIFFERENTIAL
Basophils Absolute: 0.1 10*3/uL (ref 0.0–0.1)
Basophils Relative: 1 %
Eosinophils Absolute: 0 10*3/uL (ref 0.0–0.5)
Eosinophils Relative: 0 %
Lymphocytes Relative: 4 %
Lymphs Abs: 0.7 10*3/uL (ref 0.7–4.0)
Monocytes Absolute: 0.9 10*3/uL (ref 0.1–1.0)
Monocytes Relative: 6 %
Neutro Abs: 14.3 10*3/uL — ABNORMAL HIGH (ref 1.7–7.7)
Neutrophils Relative %: 88 %

## 2018-10-14 LAB — LIPASE, BLOOD: Lipase: 13 U/L (ref 11–51)

## 2018-10-14 LAB — I-STAT BETA HCG BLOOD, ED (MC, WL, AP ONLY): I-stat hCG, quantitative: 10.5 m[IU]/mL — ABNORMAL HIGH (ref <5)

## 2018-10-14 LAB — RAPID URINE DRUG SCREEN, HOSP PERFORMED
Amphetamines: POSITIVE — AB
Barbiturates: NOT DETECTED
Benzodiazepines: NOT DETECTED
Cocaine: NOT DETECTED
Opiates: NOT DETECTED
Tetrahydrocannabinol: NOT DETECTED

## 2018-10-14 LAB — HEPATIC FUNCTION PANEL
ALT: 67 U/L — ABNORMAL HIGH (ref 0–44)
AST: 140 U/L — ABNORMAL HIGH (ref 15–41)
Albumin: 2.9 g/dL — ABNORMAL LOW (ref 3.5–5.0)
Alkaline Phosphatase: 132 U/L — ABNORMAL HIGH (ref 38–126)
Bilirubin, Direct: 0.3 mg/dL — ABNORMAL HIGH (ref 0.0–0.2)
Indirect Bilirubin: 0.7 mg/dL (ref 0.3–0.9)
Total Bilirubin: 1 mg/dL (ref 0.3–1.2)
Total Protein: 5.9 g/dL — ABNORMAL LOW (ref 6.5–8.1)

## 2018-10-14 LAB — SARS CORONAVIRUS 2 BY RT PCR (HOSPITAL ORDER, PERFORMED IN ~~LOC~~ HOSPITAL LAB): SARS Coronavirus 2: NEGATIVE

## 2018-10-14 LAB — TROPONIN I (HIGH SENSITIVITY): Troponin I (High Sensitivity): 45 ng/L — ABNORMAL HIGH (ref <18)

## 2018-10-14 LAB — CK: Total CK: 28 U/L — ABNORMAL LOW (ref 38–234)

## 2018-10-14 MED ORDER — ONDANSETRON HCL 4 MG/2ML IJ SOLN
4.0000 mg | Freq: Once | INTRAMUSCULAR | Status: AC
  Administered 2018-10-14: 4 mg via INTRAVENOUS
  Filled 2018-10-14: qty 2

## 2018-10-14 MED ORDER — SODIUM CHLORIDE 0.9 % IV BOLUS
1000.0000 mL | Freq: Once | INTRAVENOUS | Status: AC
  Administered 2018-10-14: 1000 mL via INTRAVENOUS

## 2018-10-14 MED ORDER — FAMOTIDINE IN NACL 20-0.9 MG/50ML-% IV SOLN: 20.0000 mg | Freq: Once | INTRAVENOUS | Status: DC

## 2018-10-14 MED ORDER — METOCLOPRAMIDE HCL 5 MG/ML IJ SOLN
10.0000 mg | Freq: Once | INTRAMUSCULAR | Status: DC
  Filled 2018-10-14: qty 2

## 2018-10-14 MED ORDER — SODIUM CHLORIDE 0.9% FLUSH: 3.0000 mL | Freq: Once | INTRAVENOUS | Status: DC

## 2018-10-14 MED ORDER — SODIUM CHLORIDE 0.9 % IV SOLN
Freq: Once | INTRAVENOUS | Status: AC
  Administered 2018-10-14: 22:00:00 via INTRAVENOUS

## 2018-10-14 MED ORDER — SODIUM CHLORIDE 0.9 % IV SOLN
10.0000 mg | Freq: Once | INTRAVENOUS | Status: DC
  Administered 2018-10-15: 10 mg via INTRAVENOUS
  Filled 2018-10-14: qty 1

## 2018-10-14 NOTE — ED Notes (Signed)
Pt ambulated to bathroom independently

## 2018-10-14 NOTE — ED Triage Notes (Signed)
Per EMS, pt sent from urgent care, hypotensive, SOB (started today), weakness (near syncope),  N/V/D and chills.  Given 1Lt NS - blood pressure is 94/40, pt reports chronic hypotension.  Pt reports n/v/d is "normal with my sinuses acting up."

## 2018-10-14 NOTE — ED Provider Notes (Signed)
[facility]PART[facility] Provider Note   CSN: [id number] Arrival date & time: 10/14/18  1916    History   Chief Complaint Chief Complaint  Patient presents with  . Weakness  . Shortness of Breath    HPI Meredith Cohen is a 48 y.o. female presents today for 3 days of viral-like illness.  Patient reports that 3 days ago she began having nasal congestion, postnasal drip.  Patient reports that when she swallows a large amount of postnasal drip this will cause her to vomit she reports multiple episodes of nonbloody/nonbilious emesis over the past 3 days.  Patient reports that 2 days ago she began having nonbloody diarrhea.  She reports decreased oral intake due to her nausea and generally feeling unwell.  Patient reports that this morning she developed intermittent shortness of breath worsened with ambulation and improved with rest.  [facility], test was performed and patient was leaving the facility when she became lightheaded and fell backwards.  She denies any injury from her fall and was able to help her self to the ground.  Staff at the facility noted her to be hypotensive, EMS was called to bring patient to this ER.  Patient given 1 L normal saline prior to arrival.  Denies fever/chills, cough, hemoptysis, chest pain, abdominal pain, dysuria/hematuria, extremity pain/swelling, color change, history of blood clot or any additional concerns today.  Of note patient underwent surgery for distal pancreatectomy and splenectomy for pancreatic mass in November of last year, she denies any problems or additional surgeries since that time.    HPI  Past Medical History:  Diagnosis Date  . History of alcohol use disorder   . Pancreatic mass 01/2018  . Portal vein thrombosis     Patient Active Problem List   Diagnosis Date Noted  . Pancreatic mass   . Bilateral back pain/ lower rib pain/ abdominal  pain 11/14/2017    Past Surgical History:  Procedure Laterality Date  . ECTOPIC PREGNANCY SURGERY    . ESOPHAGOGASTRODUODENOSCOPY (EGD) WITH PROPOFOL N/A 11/19/2017   Procedure: ESOPHAGOGASTRODUODENOSCOPY (EGD) WITH PROPOFOL;  Surgeon: Arta Silence, MD;  Location: [facility];  Service: Endoscopy;  Laterality: N/A;  . EUS Left 11/19/2017   Procedure: UPPER ENDOSCOPIC ULTRASOUND (EUS) LINEAR;  Surgeon: Arta Silence, MD;  Location: [facility];  Service: Endoscopy;  Laterality: Left;  . FINE NEEDLE ASPIRATION  11/19/2017   Procedure: FINE NEEDLE ASPIRATION;  Surgeon: Arta Silence, MD;  Location: [facility];  Service: Endoscopy;;  . PANCREATECTOMY  01/07/2018   HAND ASSISTED DISTAL PANCREATECTOMY AND SPLENECTOMY (N/A Abdomen)  . Tosillectomy        OB History   No obstetric history on file.      Home Medications    Prior to Admission medications   Medication Sig Start Date End Date Taking? Authorizing Provider  acetaminophen (TYLENOL) 325 MG tablet Take 2 tablets (650 mg total) by mouth every 6 (six) hours as needed for mild pain (or Fever >/= 101). 01/13/18   Stark Klein, MD  Camphor-Menthol-Methyl Sal (TIGER BALM MUSCLE RUB EX) Apply 1 application topically 2 (two) times daily as needed (pain).    [provider]  gabapentin (NEURONTIN) 300 MG capsule Take 1 capsule (300 mg total) by mouth 2 (two) times daily. 01/13/18   Stark Klein, MD  HYDROmorphone (DILAUDID) 2 MG tablet Take 1-2 tablets (2-4 mg total) by mouth every 4 (four) hours as  needed for moderate pain or severe pain. 01/13/18   Stark Klein, MD  ibuprofen (ADVIL,MOTRIN) 200 MG tablet Take 600 mg by mouth daily as needed for moderate pain.    [provider]  lidocaine (LIDODERM) 5 % Place 1 patch onto the skin daily. Remove & Discard patch within 12 hours or as directed by MD Patient not taking: Reported on 12/31/2017 11/20/17   Jean Rosenthal, MD  methocarbamol (ROBAXIN) 500 MG tablet Take 1  tablet (500 mg total) by mouth every 6 (six) hours as needed for muscle spasms. 01/13/18   Stark Klein, MD  Milk Thistle 150 MG CAPS Take 450 mg by mouth daily.    [provider]  Multiple Vitamin (MULTIVITAMIN WITH MINERALS) TABS tablet Take 1 tablet by mouth daily.    [provider]  Omega-3 Fatty Acids (OMEGA 3 PO) Take 1,250 mg by mouth daily.    [provider]  ondansetron (ZOFRAN) 4 MG tablet Take 1 tablet (4 mg total) by mouth every 6 (six) hours. 01/29/18   Masoudi, Dorthula Rue, MD  OVER THE COUNTER MEDICATION Take 1 capsule by mouth 4 (four) times a week. Hemp oil    [provider]  SUPER B COMPLEX/C CAPS Take 1 tablet by mouth daily.    [provider]    Family History Family History  Adopted: Yes    Social History Social History   Tobacco Use  . Smoking status: Current Every Day Smoker    Types: Cigarettes  . Smokeless tobacco: Never Used  . Tobacco comment: 5 cigarettes/day  Substance Use Topics  . Alcohol use: Not Currently  . Drug use: Never     Allergies   Adhesive [tape] and Penicillins   Review of Systems Review of Systems Ten systems are reviewed and are negative for acute change except as noted in the HPI  Physical Exam Updated Vital Signs BP (!) 89/50   Pulse 92   Temp 98.5 F (36.9 C) (Oral)   Resp 18   Ht _0  (1.676 m)   Wt 56.7 kg   LMP 09/30/2018 (Approximate)   SpO2 99%   BMI 20.18 kg/m   Physical Exam Constitutional:      General: She is not in acute distress.    Appearance: Normal appearance. She is well-developed. She is not ill-appearing or diaphoretic.  HENT:     Head: Normocephalic and atraumatic.     Right Ear: External ear normal.     Left Ear: External ear normal.     Nose: Nose normal.  Eyes:     General: Vision grossly intact. Gaze aligned appropriately.     Pupils: Pupils are equal, round, and reactive to light.  Neck:     Musculoskeletal: Normal range of motion.      Trachea: Trachea and phonation normal. No tracheal deviation.  Cardiovascular:     Rate and Rhythm: Normal rate and regular rhythm.     Pulses:          Radial pulses are 2+ on the right side and 2+ on the left side.       Dorsalis pedis pulses are 2+ on the right side and 2+ on the left side.     Heart sounds: Normal heart sounds.  Pulmonary:     Effort: Pulmonary effort is normal. No accessory muscle usage or respiratory distress.     Breath sounds: Normal breath sounds. No decreased breath sounds or rhonchi.  Chest:     Chest  wall: No deformity, tenderness or crepitus.  Abdominal:     General: There is no distension.     Palpations: Abdomen is soft.     Tenderness: There is no abdominal tenderness. There is no guarding or rebound.  Musculoskeletal: Normal range of motion.     Right lower leg: She exhibits no tenderness. No edema.     Left lower leg: She exhibits no tenderness. No edema.  Skin:    General: Skin is warm and dry.  Neurological:     Mental Status: She is alert.     GCS: GCS eye subscore is 4. GCS verbal subscore is 5. GCS motor subscore is 6.     Comments: Speech is clear and goal oriented, follows commands Major Cranial nerves without deficit, no facial droop Moves extremities without ataxia, coordination intact  Psychiatric:        Behavior: Behavior normal.    ED Treatments / Results  Labs (all labs ordered are listed, but only abnormal results are displayed) Labs Reviewed  BASIC METABOLIC PANEL - Abnormal; Notable for the following components:      Result Value   Sodium 134 (*)    Potassium 3.3 (*)    CO2 21 (*)    Glucose, Bld 129 (*)    Creatinine, Ser 2.20 (*)    Calcium 7.6 (*)    GFR calc non Af Amer 26 (*)    GFR calc Af Amer 30 (*)    All other components within normal limits  CBC - Abnormal; Notable for the following components:   WBC 15.8 (*)    RBC 3.78 (*)    MCH 34.7 (*)    RDW 15.8 (*)    Platelets 120 (*)    All other  components within normal limits  HEPATIC FUNCTION PANEL - Abnormal; Notable for the following components:   Total Protein 5.9 (*)    Albumin 2.9 (*)    AST 140 (*)    ALT 67 (*)    Alkaline Phosphatase 132 (*)    Bilirubin, Direct 0.3 (*)    All other components within normal limits  D-DIMER, QUANTITATIVE (NOT AT [facility]) - Abnormal; Notable for the following components:   D-Dimer, Quant 4.33 (*)    All other components within normal limits  DIFFERENTIAL - Abnormal; Notable for the following components:   Neutro Abs 14.3 (*)    All other components within normal limits  CBG MONITORING, ED - Abnormal; Notable for the following components:   Glucose-Capillary 115 (*)    All other components within normal limits  I-STAT BETA HCG BLOOD, ED ([facility], [facility], [facility] ONLY) - Abnormal; Notable for the following components:   I-stat hCG, quantitative 10.5 (*)    All other components within normal limits  TROPONIN I (HIGH SENSITIVITY) - Abnormal; Notable for the following components:   Troponin I (High Sensitivity) 45 (*)    All other components within normal limits  SARS CORONAVIRUS 2 (HOSPITAL ORDER, [place] LAB)  LIPASE, BLOOD  URINALYSIS, ROUTINE W REFLEX MICROSCOPIC  RAPID URINE DRUG SCREEN, HOSP PERFORMED  CK  TROPONIN I (HIGH SENSITIVITY)    EKG None  Radiology Dg Chest Portable 1 View  Result Date: 10/14/2018 CLINICAL DATA:  Shortness of breath.  Near syncope.  Hypotension. EXAM: PORTABLE CHEST 1 VIEW COMPARISON:  None. FINDINGS: The cardiomediastinal silhouette is within normal limits. The lungs are well inflated and clear. There is no evidence of pleural effusion or pneumothorax. No acute  osseous abnormality is identified. IMPRESSION: No active disease. Electronically Signed   By: Logan Bores M.D.   On: 10/14/2018 20:21    Procedures Procedures (including critical care time)  Medications Ordered in ED Medications  sodium chloride flush (NS) 0.9 % injection 3  mL (has no administration in time range)  sodium chloride 0.9 % bolus 1,000 mL (has no administration in time range)  sodium chloride 0.9 % bolus 1,000 mL (1,000 mLs Intravenous New Bag/Given 10/14/18 2057)  ondansetron (ZOFRAN) injection 4 mg (4 mg Intravenous Given 10/14/18 2145)  0.9 %  sodium chloride infusion ( Intravenous New Bag/Given 10/14/18 2156)     Initial Impression / Assessment and Plan / ED Course  I have reviewed the triage vital signs and the nursing notes.  Pertinent labs & imaging results that were available during my care of the patient were reviewed by me and considered in my medical decision making (see chart for details).  Clinical Course as of Oct 14 2354  Tue Oct 14, 2018  2340 Internal Medicine   [BM]    Clinical Course User Index [BM] Deliah Boston, PA-C   Initial evaluation patient overall well-appearing and in no acute distress sitting comfortably in bed.  Hypotensive on monitor blood pressure systolic of 69C, diastolic 78L.  She is fully alert and oriented and mentating appropriately, no tachycardia or hypoxia on room air.  She reports that she is feeling well at this time and has no complaints she denies any lightheadedness, dizziness, chest pain, shortness of breath or any additional concerns.  Patient reports that she is only here because the urgent care insisted that she come.  Heart regular rate and rhythm without murmur, lungs clear to auscultation bilaterally, abdomen soft nontender and without peritoneal signs, extremities neurovascular intact x4 without sign of DVT.  Patient on cardiac monitor with 1 L fluid bolus running. - CBC with leukocytosis of 15.8 with left shift BMP shows creatinine of 2.2, patient with AKI suspicious for dehydration due to N/V/D and decreased oral intake will give additional fluids here in the ER Chest x-ray:  IMPRESSION: No active disease. - - LFTs returned with elevated ALT, AST, alk phos Troponin elevated at 45 Lipase  within normal limits D-dimer 4.33 Beta-hCG 10.5, suspect false positive we will follow-up with urine pregnancy test COVID-19 test pending Urinalysis pending  Patient reassessed resting comfortably no acute distress remains hypotensive otherwise vital signs within normal limits.  Patient with AKI today would not be able to perform CT angiogram to evaluate for pulmonary embolism she is not tachycardic or hypoxic on room air. Case discussed with Dr. Johnney Killian who is seeing patient. - Patient seen and evaluated by Dr. Johnney Killian, suspect secondary to dehydration as patient without hypoxia low suspicsion for PE as etiology of symptoms, will hold off on heparin at this point, plan of care is additional fluid bolus, add UDS and orthostatics, and consult to hospitalist for admission.  - UDS positive for amphetamines, no listed medications account for this Urinalysis is grossly contaminated COVID-19 negative - Patient has been ambulating around the emergency department without assistance, on reassessment she is stable appearing and in no acute distress she states understanding of need for admission and is agreeable to plan of care.  Discussed case with medical teaching service will be seeing patient for admission.  Patient has been admitted for further evaluation and management.   Lizvet Chunn was evaluated in Emergency Department on 10/14/2018 for the symptoms described in the history of  present illness. She was evaluated in the context of the global COVID-19 pandemic, which necessitated consideration that the patient might be at risk for infection with the SARS-CoV-2 virus that causes COVID-19. Institutional protocols and algorithms that pertain to the evaluation of patients at risk for COVID-19 are in a state of rapid change based on information released by regulatory bodies including the CDC and federal and state organizations. These policies and algorithms were followed during the patient's care in the  ED.  Note: Portions of this report may have been transcribed using voice recognition software. Every effort was made to ensure accuracy; however, inadvertent computerized transcription errors may still be present. Final Clinical Impressions(s) / ED Diagnoses   Final diagnoses:  Dehydration  Elevated troponin  Elevated d-dimer  AKI (acute kidney injury) [facility])  Elevated LFTs    ED Discharge Orders    None       Gari Crown 10/14/18 2358    Charlesetta Shanks, MD 10/26/18 [phone]

## 2018-10-15 ENCOUNTER — Inpatient Hospital Stay (HOSPITAL_COMMUNITY): Payer: Self-pay

## 2018-10-15 DIAGNOSIS — R3129 Other microscopic hematuria: Secondary | ICD-10-CM | POA: Diagnosis present

## 2018-10-15 DIAGNOSIS — N179 Acute kidney failure, unspecified: Secondary | ICD-10-CM | POA: Diagnosis present

## 2018-10-15 DIAGNOSIS — Z888 Allergy status to other drugs, medicaments and biological substances status: Secondary | ICD-10-CM

## 2018-10-15 DIAGNOSIS — Z880 Allergy status to penicillin: Secondary | ICD-10-CM

## 2018-10-15 DIAGNOSIS — R7401 Elevation of levels of liver transaminase levels: Secondary | ICD-10-CM | POA: Diagnosis present

## 2018-10-15 DIAGNOSIS — E860 Dehydration: Secondary | ICD-10-CM

## 2018-10-15 DIAGNOSIS — R0982 Postnasal drip: Secondary | ICD-10-CM

## 2018-10-15 DIAGNOSIS — J019 Acute sinusitis, unspecified: Secondary | ICD-10-CM | POA: Diagnosis present

## 2018-10-15 DIAGNOSIS — D72829 Elevated white blood cell count, unspecified: Secondary | ICD-10-CM | POA: Diagnosis present

## 2018-10-15 DIAGNOSIS — B9620 Unspecified Escherichia coli [E. coli] as the cause of diseases classified elsewhere: Secondary | ICD-10-CM

## 2018-10-15 DIAGNOSIS — R740 Nonspecific elevation of levels of transaminase and lactic acid dehydrogenase [LDH]: Secondary | ICD-10-CM

## 2018-10-15 DIAGNOSIS — R1013 Epigastric pain: Secondary | ICD-10-CM

## 2018-10-15 DIAGNOSIS — R825 Elevated urine levels of drugs, medicaments and biological substances: Secondary | ICD-10-CM

## 2018-10-15 DIAGNOSIS — Z7289 Other problems related to lifestyle: Secondary | ICD-10-CM

## 2018-10-15 DIAGNOSIS — Z9081 Acquired absence of spleen: Secondary | ICD-10-CM

## 2018-10-15 DIAGNOSIS — F339 Major depressive disorder, recurrent, unspecified: Secondary | ICD-10-CM

## 2018-10-15 DIAGNOSIS — R0981 Nasal congestion: Secondary | ICD-10-CM

## 2018-10-15 DIAGNOSIS — R197 Diarrhea, unspecified: Secondary | ICD-10-CM

## 2018-10-15 DIAGNOSIS — E876 Hypokalemia: Secondary | ICD-10-CM

## 2018-10-15 DIAGNOSIS — R7881 Bacteremia: Secondary | ICD-10-CM

## 2018-10-15 DIAGNOSIS — Z79899 Other long term (current) drug therapy: Secondary | ICD-10-CM

## 2018-10-15 DIAGNOSIS — Z90411 Acquired partial absence of pancreas: Secondary | ICD-10-CM

## 2018-10-15 DIAGNOSIS — R7989 Other specified abnormal findings of blood chemistry: Secondary | ICD-10-CM | POA: Diagnosis present

## 2018-10-15 DIAGNOSIS — R778 Other specified abnormalities of plasma proteins: Secondary | ICD-10-CM | POA: Diagnosis present

## 2018-10-15 DIAGNOSIS — R891 Abnormal level of hormones in specimens from other organs, systems and tissues: Secondary | ICD-10-CM

## 2018-10-15 DIAGNOSIS — K7460 Unspecified cirrhosis of liver: Secondary | ICD-10-CM

## 2018-10-15 LAB — CBC WITH DIFFERENTIAL/PLATELET
Abs Immature Granulocytes: 0.12 10*3/uL — ABNORMAL HIGH (ref 0.00–0.07)
Basophils Absolute: 0.1 10*3/uL (ref 0.0–0.1)
Basophils Relative: 1 %
Eosinophils Absolute: 0 10*3/uL (ref 0.0–0.5)
Eosinophils Relative: 0 %
HCT: 34.5 % — ABNORMAL LOW (ref 36.0–46.0)
Hemoglobin: 11.9 g/dL — ABNORMAL LOW (ref 12.0–15.0)
Immature Granulocytes: 1 %
Lymphocytes Relative: 4 %
Lymphs Abs: 0.5 10*3/uL — ABNORMAL LOW (ref 0.7–4.0)
MCH: 34.6 pg — ABNORMAL HIGH (ref 26.0–34.0)
MCHC: 34.5 g/dL (ref 30.0–36.0)
MCV: 100.3 fL — ABNORMAL HIGH (ref 80.0–100.0)
Monocytes Absolute: 0.8 10*3/uL (ref 0.1–1.0)
Monocytes Relative: 7 %
Neutro Abs: 10 10*3/uL — ABNORMAL HIGH (ref 1.7–7.7)
Neutrophils Relative %: 87 %
Platelets: 119 10*3/uL — ABNORMAL LOW (ref 150–400)
RBC: 3.44 MIL/uL — ABNORMAL LOW (ref 3.87–5.11)
RDW: 16.2 % — ABNORMAL HIGH (ref 11.5–15.5)
WBC: 11.5 10*3/uL — ABNORMAL HIGH (ref 4.0–10.5)
nRBC: 0 % (ref 0.0–0.2)

## 2018-10-15 LAB — URINALYSIS, ROUTINE W REFLEX MICROSCOPIC
Bilirubin Urine: NEGATIVE
Glucose, UA: NEGATIVE mg/dL
Ketones, ur: NEGATIVE mg/dL
Nitrite: NEGATIVE
Protein, ur: 100 mg/dL — AB
RBC / HPF: >50 RBC/hpf — ABNORMAL HIGH (ref 0–5)
Specific Gravity, Urine: 1.029 (ref 1.005–1.030)
WBC, UA: >50 WBC/hpf (ref 0–5)
pH: 5 (ref 5.0–8.0)

## 2018-10-15 LAB — COMPREHENSIVE METABOLIC PANEL
ALT: 56 U/L — ABNORMAL HIGH (ref 0–44)
AST: 108 U/L — ABNORMAL HIGH (ref 15–41)
Albumin: 2.6 g/dL — ABNORMAL LOW (ref 3.5–5.0)
Alkaline Phosphatase: 113 U/L (ref 38–126)
Anion gap: 12 (ref 5–15)
BUN: 22 mg/dL — ABNORMAL HIGH (ref 6–20)
CO2: 21 mmol/L — ABNORMAL LOW (ref 22–32)
Calcium: 6.9 mg/dL — ABNORMAL LOW (ref 8.9–10.3)
Chloride: 102 mmol/L (ref 98–111)
Creatinine, Ser: 2.24 mg/dL — ABNORMAL HIGH (ref 0.44–1.00)
GFR calc Af Amer: 30 mL/min — ABNORMAL LOW (ref >60)
GFR calc non Af Amer: 25 mL/min — ABNORMAL LOW (ref >60)
Glucose, Bld: 93 mg/dL (ref 70–99)
Potassium: 3.5 mmol/L (ref 3.5–5.1)
Sodium: 135 mmol/L (ref 135–145)
Total Bilirubin: 0.5 mg/dL (ref 0.3–1.2)
Total Protein: 5.4 g/dL — ABNORMAL LOW (ref 6.5–8.1)

## 2018-10-15 LAB — BLOOD CULTURE ID PANEL (REFLEXED)

## 2018-10-15 LAB — PROTIME-INR
INR: 1.2 (ref 0.8–1.2)
Prothrombin Time: 15 seconds (ref 11.4–15.2)

## 2018-10-15 LAB — POC URINE PREG, ED: Preg Test, Ur: NEGATIVE

## 2018-10-15 LAB — TROPONIN I (HIGH SENSITIVITY): Troponin I (High Sensitivity): 21 ng/L — ABNORMAL HIGH (ref <18)

## 2018-10-15 LAB — FERRITIN: Ferritin: 230 ng/mL (ref 11–307)

## 2018-10-15 MED ORDER — ONDANSETRON HCL 4 MG PO TABS: 4.0000 mg | ORAL_TABLET | Freq: Four times a day (QID) | ORAL | Status: DC | PRN

## 2018-10-15 MED ORDER — ACETAMINOPHEN 500 MG PO TABS
500.0000 mg | ORAL_TABLET | Freq: Four times a day (QID) | ORAL | Status: DC | PRN
  Administered 2018-10-16: 500 mg via ORAL
  Filled 2018-10-15: qty 1

## 2018-10-15 MED ORDER — METHOCARBAMOL 500 MG PO TABS
750.0000 mg | ORAL_TABLET | Freq: Once | ORAL | Status: AC
  Administered 2018-10-15: 750 mg via ORAL
  Filled 2018-10-15: qty 2

## 2018-10-15 MED ORDER — ONDANSETRON HCL 4 MG/2ML IJ SOLN: 4.0000 mg | Freq: Four times a day (QID) | INTRAMUSCULAR | Status: DC | PRN

## 2018-10-15 MED ORDER — BUTALBITAL-APAP-CAFFEINE 50-325-40 MG PO TABS
1.0000 | ORAL_TABLET | Freq: Once | ORAL | Status: AC
  Administered 2018-10-15: 1 via ORAL
  Filled 2018-10-15: qty 1

## 2018-10-15 MED ORDER — TECHNETIUM TO 99M ALBUMIN AGGREGATED
1.6500 | Freq: Once | INTRAVENOUS | Status: AC | PRN
  Administered 2018-10-15: 1.65 via INTRAVENOUS

## 2018-10-15 MED ORDER — ACETAMINOPHEN 650 MG RE SUPP: 650.0000 mg | Freq: Four times a day (QID) | RECTAL | Status: DC | PRN

## 2018-10-15 MED ORDER — SODIUM CHLORIDE 0.9 % IV SOLN
2.0000 g | INTRAVENOUS | Status: DC
  Administered 2018-10-15 – 2018-10-16 (×2): 2 g via INTRAVENOUS
  Filled 2018-10-15: qty 20
  Filled 2018-10-15 (×2): qty 2

## 2018-10-15 MED ORDER — SODIUM CHLORIDE 0.9% FLUSH
3.0000 mL | Freq: Two times a day (BID) | INTRAVENOUS | Status: DC
  Administered 2018-10-15 – 2018-10-17 (×2): 3 mL via INTRAVENOUS

## 2018-10-15 MED ORDER — SODIUM CHLORIDE 0.9 % IV SOLN
INTRAVENOUS | Status: DC
  Administered 2018-10-15: 1000 mL via INTRAVENOUS
  Administered 2018-10-15 – 2018-10-16 (×2): via INTRAVENOUS

## 2018-10-15 MED ORDER — HAEMOPHILUS B POLYSAC CONJ VAC 10 MCG IJ SOLR
0.5000 mL | Freq: Once | INTRAMUSCULAR | Status: AC
  Administered 2018-10-15: 0.5 mL via INTRAMUSCULAR
  Filled 2018-10-15: qty 0.5

## 2018-10-15 MED ORDER — FAMOTIDINE 20 MG PO TABS
20.0000 mg | ORAL_TABLET | Freq: Every day | ORAL | Status: DC
  Administered 2018-10-15 – 2018-10-17 (×3): 20 mg via ORAL
  Filled 2018-10-15 (×3): qty 1

## 2018-10-15 MED ORDER — LIP MEDEX EX OINT
TOPICAL_OINTMENT | CUTANEOUS | Status: DC | PRN
  Filled 2018-10-15: qty 7

## 2018-10-15 MED ORDER — RAMELTEON 8 MG PO TABS
8.0000 mg | ORAL_TABLET | Freq: Once | ORAL | Status: AC
  Administered 2018-10-15: 8 mg via ORAL
  Filled 2018-10-15: qty 1

## 2018-10-15 MED ORDER — SERTRALINE HCL 50 MG PO TABS
50.0000 mg | ORAL_TABLET | Freq: Every day | ORAL | Status: DC
  Administered 2018-10-16 – 2018-10-17 (×2): 50 mg via ORAL
  Filled 2018-10-15 (×3): qty 1

## 2018-10-15 MED ORDER — ACETAMINOPHEN 325 MG PO TABS
650.0000 mg | ORAL_TABLET | Freq: Four times a day (QID) | ORAL | Status: DC | PRN
  Administered 2018-10-15 (×2): 650 mg via ORAL
  Filled 2018-10-15 (×2): qty 2

## 2018-10-15 MED ORDER — ENOXAPARIN SODIUM 30 MG/0.3ML ~~LOC~~ SOLN
30.0000 mg | SUBCUTANEOUS | Status: DC
  Administered 2018-10-17: 30 mg via SUBCUTANEOUS
  Filled 2018-10-15 (×3): qty 0.3

## 2018-10-15 MED ORDER — GUAIFENESIN ER 600 MG PO TB12
600.0000 mg | ORAL_TABLET | Freq: Two times a day (BID) | ORAL | Status: DC
  Administered 2018-10-15 – 2018-10-17 (×6): 600 mg via ORAL
  Filled 2018-10-15 (×6): qty 1

## 2018-10-15 MED ORDER — CALCIUM GLUCONATE-NACL 2-0.675 GM/100ML-% IV SOLN
2.0000 g | Freq: Once | INTRAVENOUS | Status: AC
  Administered 2018-10-15: 2000 mg via INTRAVENOUS
  Filled 2018-10-15: qty 100

## 2018-10-15 MED ORDER — PNEUMOCOCCAL 13-VAL CONJ VACC IM SUSP
0.5000 mL | INTRAMUSCULAR | Status: AC
  Administered 2018-10-16: 0.5 mL via INTRAMUSCULAR
  Filled 2018-10-15 (×2): qty 0.5

## 2018-10-15 NOTE — ED Notes (Signed)
Per Dr. Darrick Meigs with internal medicine RN to continue to monitor pt BP. RN to contact MD if BP continues to BP or changes in mentation or s/s.

## 2018-10-15 NOTE — Progress Notes (Signed)
Patient agitated,swearing at RN,also refused telemetry, states " I don't have any heart problem". And started pulling telemetry off her. MD on call made aware. MD to come and talk to patient. Chasen Mendell, Wonda Cheng, Therapist, sports

## 2018-10-15 NOTE — ED Notes (Signed)
Pt given graham crackers and sprite zero

## 2018-10-15 NOTE — H&P (Addendum)
Date: 10/15/2018               Patient Name:  Meredith BergerRachel Bronkema MRN: [id number]  DOB: Sep 04, 1970 Age / Sex: 48 y.o., female   PCP: Chevis PrettyMasoudi, Elhamalsadat, MD         Medical Service: Internal Medicine Teaching Service         Attending Physician: Dr. Inez CatalinaMullen, Emily B, MD    First Contact: Dr. Mcarthur RossettiAslam Pager: [phone]  Second Contact: Dr. 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  Patient presents the ED today following instructions from the urgent care after being seen there for sinus congestion, postnasal drainage with subsequent nausea and vomiting x4 days.  As she was walking out the door of the urgent care, patient developed lightheadedness and had a syncopal event.  Denies hitting her head.    Since symptom onset, she has had very little food and fluid intake.  Patient notes that the sinus congestion is a chronic issue that she is struggled with for several years and has worsened during times of seasonal allergies.  This nasal congestion results and postnasal drainage to the extent of making her very nauseated and occasionally vomit.  Patient denies any recent fever, chills.  Does endorse diarrhea over the past few days.  Denies abdominal pain aside from epigastric pain after vomiting.  Denies hematemesis.  Patient initially denied being sexually active however after her ex-husband left the room, patient did note that she is in a relationship and last sexual intercourse was approximately 3 weeks ago.  LMP also approximately 3 weeks ago.  Denies being on contraception. Patient denies any current illicit drug use, alcohol use.  Endorses infrequent use of  alcohol.  Meds:  Current Meds  Medication Sig  . acetaminophen (TYLENOL) 325 MG tablet Take 2 tablets (650 mg total) by mouth every 6 (six) hours as needed for mild pain (or Fever >/= 101).  . diphenhydrAMINE-PE-APAP (GNP FLU RELIEF THERAPY NIGHT) 12.5-5-325 MG/15ML LIQD Take 15 mLs by mouth 2 (two) times daily as needed (for cough and congestion).  Marland Kitchen. oxymetazoline (AFRIN) 0.05 % nasal spray Place 1 spray into both nostrils 2 (two) times daily as needed for congestion.  Marland Kitchen. Phenyleph-Doxylamine-DM-APAP (ALKA-SELTZER PLS ALLERGY & CGH) 5-6.25-10-325 MG CAPS Take 1 capsule by mouth 2 (two) times daily as needed (for cough and congestion).  . ranitidine (ZANTAC) 75 MG tablet Take 75 mg by mouth 2 (two) times daily.  . sertraline (ZOLOFT) 50 MG tablet Take 50 mg by mouth daily.     Allergies: Allergies as of 10/14/2018 - Review Complete 10/14/2018  Allergen Reaction Noted  . Adhesive [tape] Rash 12/31/2017  . Penicillins Itching 11/14/2017   Past Medical History:  Diagnosis Date  . History of alcohol use disorder   . Pancreatic mass 01/2018  . Portal vein thrombosis     Family History: Patient was adopted and is unaware of family medical history.  Social History: Patient is currently residing with an ex husband.  They have been separated for approximately 9 months and patient is working towards moving out.  She does note that she is in a relationship at this time however ex  is not aware of this.  Review of Systems: Review of Systems  Constitutional: Negative for chills and fever.  HENT: Positive for congestion and sinus pain. Negative for ear pain, nosebleeds and sore throat.   Eyes: Negative for blurred vision.  Respiratory: Negative for cough and shortness of breath.   Cardiovascular: Negative for chest pain, palpitations, orthopnea and leg swelling.  Gastrointestinal: Positive for diarrhea, nausea and vomiting. Negative for abdominal pain, blood in stool, constipation, heartburn  and melena.  Genitourinary: Negative for dysuria, frequency and urgency.  Musculoskeletal: Negative for myalgias.  Skin: Negative for rash.  Neurological: Positive for headaches. Negative for dizziness and weakness.  Endo/Heme/Allergies: Does not bruise/bleed easily.  Psychiatric/Behavioral: Negative for depression.     Physical Exam: Blood pressure (!) 91/57, pulse 97, temperature 98.5 F (36.9 C), temperature source Oral, resp. rate (!) 28, height 5\' 6"  (1.676 m), weight 56.7 kg, last menstrual period 09/30/2018, SpO2 99 %.  GENERAL: in no acute distress  HEENT: head atraumatic. No conjunctival injection. Grossly apparent deviated septum. No mucosal inflammation or rhinorrhea appreciated. CARDIAC: heart RRR. No peripheral edema. No jvd. PULMONARY: acyanotic. Lung sounds clear to auscultation. Breathing comfortably on RA. ABDOMEN: soft. Mild tenderness of epigastric region.  Nondistended. No organomegaly appreciated. NEURO: CN II-XII grossly intact. SKIN: no rash or lesions on limited exam  PSYCH: A/Ox3. Normal affect  EKG: personally reviewed my interpretation is poor quality EKG. No apparent persistent ST elevation, depression or BBB. Repeat ordered.   CXR: personally reviewed my interpretation is unremarkable.  Assessment & Plan by Problem: Principal Problem:   AKI (acute kidney injury) ([place]) Active Problems:   Liver cirrhosis (HCC)   Leukocytosis   Transaminitis   Diarrhea   Elevated troponin   Elevated d-dimer   Microscopic hematuria  Severe dehydration-->AKI. Hypotension upon ED arrival which improved with 3L bolus. BP appears to run low at baseline. AKI Likely attributable to poor PO intake x4d. Cr 2.2. Baseline is <1. BUN 18. GFR 26. N/v/d likely cause of hypokalemia, borderline low bicarb. AG 15.  -continue IVF -repeat labs in AM -continuous tele for hypotension and tachycardia  Chronic sinus congestion with post nasal drainage. Increased drainage over past few  days. Patient has been using mucinex and afrin at home. Continued use of afrin is associated with rebound congestion.  -CT sinuses revealed no acute findings however upon my person review, septum appears severely deviated and are likely contributing to these chronic issues.  -zofran for n/v -mucinex  -Patient should be advised to avoid overuse of afrin.  -ENT referral at discharge for eval/treatment of chronic sinusitis and deviated septum.  Leukocytosis. 15.8 on admission. Denies any recent s/s of infection including fever, chills or infection source. Likely multifactorial in nature--dehydration, possible infection/inflammatory component.  -UA did have bacteria however this was likely an unclean catch. Repeat UA with clean catch.  -repeat CBC in am  -given splenectomy history, patient is at increased risk for infection. She is unaware of receiving any vaccinations prior to splenectomy. This may need to be investigated during hospital stay.  Troponin elevation: 45. No immediately concerning findings on EKG however was of poor quality so will repeat. Patient denies chest pain aside from when she vomits. Elevation in troponin may be attributable to dehydration. Elevated D-dimer. 4.33 on admission. Likely multifactorial--dehydration, inflammation/inflammatory component. Geneva score 3--moderate risk: +2 for infection (WBCs 15), +1 for dehydration. Depending on results in AM, may need to consider CTA however clinical picture does not really fit with PE.  Diarrhea--4-5/day. -likely attributable to nasal drainage -no recent abx use.  -if symptoms persist, infectious workup may be warranted.  Liver Cirrhosis/Transaminitis: AST 140. ALT 67. Baseline LFTs wnl. This does fit picture of ETOH induced given history of ETOH abuse. Patient denied any recent use. LFTs may be elevated 2/2 dehydration as well. Will be better assessed after fluid replacement -MRI abd in 2019 supports liver cirrhosis dx however I  was unable to find a biopsy for further grading of cirrhotic pattern. -platelets 120 on admission. likely multifactorial. Could be 2/2 cirrhosis or response to infection/inflammatory process. -repeat labs in AM. -Abd US  Elevated serum B-HCG. 10.5. Will repeat with urine HCG. Patient is sexually active. Last sexual encounter approx 3 weeks ago. LMP also around that time. No current contraceptive use.  DISCRETION ADVISED: Patient is currently residing with an ex-partner who is unaware of the relationship. She wishes the results of the repeat test not be discussed in front of this individual.   Depression: zoloft 50mg  daily  Mild, asymptomatic hypokalemia. 3.3 on admission. Hypocalcemia. 7.6. Corrected calcium 8.5 UDS+ for amphetamines. Given patient's nasal congestion history, it is likely that she may have used something resulting in this result such as phenylephrine or pseudoephrine. Patient denied any current illicit drug use however does have history of such.  CODE STATUS: FULL Diet: 2g Na IV fluids: NS @ 14025mL/hr GI prophylaxis: pepcid DVT for prophylaxis: Lovenox Micro: UA pending ABX: none at this time however this should be revisited after repeat labs. Lines: IV Social considerations: DISCRETION ADVISED: Patient is currently residing with an ex-partner who is unaware of the relationship. She wishes the results of the repeat test not be discussed in front of this individual.    Dispo: Admit patient to Inpatient with expected length of stay greater than 2 midnights.  Addendum 1: Morning labs update: WBCs 11.5 with left shift present. LFTs and troponin decreased likely due to fluid replacement. Abd US neg for any acute changes. AKI still present--no improvement in Cr/BUN however I speculate that these should improve throughout the day with continued hydration. Calcium 6.9--corrected for albumin is 8. Continue to monitor.  Signed: Elige Radonhristian, Burnadette Baskett, MD 10/15/2018, 2:13 AM  Pager:  @[phone] @

## 2018-10-15 NOTE — ED Notes (Signed)
Tele   Breakfast ordered  

## 2018-10-15 NOTE — ED Notes (Signed)
To ultrasound with transporter. Vss.

## 2018-10-15 NOTE — ED Notes (Signed)
Pt ambulatory to restroom with steady gait.

## 2018-10-15 NOTE — ED Notes (Signed)
Sodium restricted lunch tray ordered

## 2018-10-15 NOTE — ED Notes (Signed)
Secure message sent to Dr. Daryll Drown, Raquel Sarna r/t low BP 82/55 (64). Pt does not complain of any S/S

## 2018-10-15 NOTE — Progress Notes (Signed)
PHARMACY - PHYSICIAN COMMUNICATION CRITICAL VALUE ALERT - BLOOD CULTURE IDENTIFICATION (BCID)  Meredith Cohen is an 48 y.o. female who presented to [facility] on 10/14/2018 with a chief complaint of AKI secondary to dehydration and leukocytosis  Assessment:  3/3 ecoli - no resistance gene detected  Name of physician (or Provider) Contacted: Dr. Shan Levans  Current antibiotics: none  Changes to prescribed antibiotics recommended:  Start ceftriaxone 2 g IV q24h  Results for orders placed or performed during the hospital encounter of 10/14/18  Blood Culture ID Panel (Reflexed) (Collected: 10/15/2018  1:15 AM)  Result Value Ref Range   Enterococcus species NOT DETECTED NOT DETECTED   Listeria monocytogenes NOT DETECTED NOT DETECTED   Staphylococcus species NOT DETECTED NOT DETECTED   Staphylococcus aureus (BCID) NOT DETECTED NOT DETECTED   Streptococcus species NOT DETECTED NOT DETECTED   Streptococcus agalactiae NOT DETECTED NOT DETECTED   Streptococcus pneumoniae NOT DETECTED NOT DETECTED   Streptococcus pyogenes NOT DETECTED NOT DETECTED   Acinetobacter baumannii NOT DETECTED NOT DETECTED   Enterobacteriaceae species DETECTED (A) NOT DETECTED   Enterobacter cloacae complex NOT DETECTED NOT DETECTED   Escherichia coli DETECTED (A) NOT DETECTED   Klebsiella oxytoca NOT DETECTED NOT DETECTED   Klebsiella pneumoniae NOT DETECTED NOT DETECTED   Proteus species NOT DETECTED NOT DETECTED   Serratia marcescens NOT DETECTED NOT DETECTED   Carbapenem resistance NOT DETECTED NOT DETECTED   Haemophilus influenzae NOT DETECTED NOT DETECTED   Neisseria meningitidis NOT DETECTED NOT DETECTED   Pseudomonas aeruginosa NOT DETECTED NOT DETECTED   Candida albicans NOT DETECTED NOT DETECTED   Candida glabrata NOT DETECTED NOT DETECTED   Candida krusei NOT DETECTED NOT DETECTED   Candida parapsilosis NOT DETECTED NOT DETECTED   Candida tropicalis NOT DETECTED NOT DETECTED   Vertis Kelch, PharmD PGY2  Cardiology Pharmacy Resident Phone [phone] 10/15/2018       3:58 PM  Please check [email or web address] for unit-specific pharmacist phone numbers

## 2018-10-15 NOTE — ED Notes (Signed)
Pt to Nuclear Medicine with transporter.

## 2018-10-15 NOTE — Progress Notes (Signed)
New Admission Note:  Arrival Method: Stretcher from Nuclear Medicine with RN Mental Orientation: A&O x4 Telemetry: Box 14, CCMD notified Assessment: Completed Skin: N/A IV: L AC, NS @ 125 Pain: 10/10- headache, right shoulder pain Tubes: N/A Safety Measures: Safety Fall Prevention Plan was given, discussed and signed. Admission: Completed 74M Orientation: Patient has been orientated to the room, unit and the staff. Family: Significant other at bedside.   Orders have been reviewed and implemented. Will continue to monitor the patient. Call light has been placed within reach.  1445: RN notified on call MD, Ronnald Ramp, about patient's complaint of a persistent headache and right shoulder pain. Patient states that tylenol is not effective. RN awaiting new orders or call back.  Nena Polio BSN, RN  Phone Number: [phone]

## 2018-10-15 NOTE — Progress Notes (Signed)
Subjective: Patient seen and examined at bedside this AM in the ED. She is resting comfortably in bed and reports no acute concerns. She states having nausea/vomiting/diarrhea for four days secondary to postnasal drainage with poor PO intake resulting in a syncopal episode yesterday.   Objective:  Vital signs in last 24 hours: Vitals:   10/15/18 1200 10/15/18 1245 10/15/18 1315 10/15/18 1345  BP: (!) 84/58 (!) 84/49 (!) 89/56 (!) 88/70  Pulse: (!) 107 100 (!) 109 (!) 107  Resp: (!) 21 (!) 25    Temp:      TempSrc:      SpO2: 97% 97% 97% 99%  Weight:      Height:       Physical Exam  Constitutional: She is oriented to person, place, and time and well-developed, well-nourished, and in no distress.  HENT:  Head: Normocephalic and atraumatic.  Eyes: Pupils are equal, round, and reactive to light. Conjunctivae and EOM are normal. No scleral icterus.  Neck: Normal range of motion. Neck supple. No JVD present.  Cardiovascular: Normal rate, normal heart sounds and intact distal pulses. Exam reveals no gallop and no friction rub.  No murmur heard. Pulmonary/Chest: Effort normal and breath sounds normal. No respiratory distress. She has no wheezes. She exhibits no tenderness.  Abdominal: Soft. Bowel sounds are normal. She exhibits no distension. There is no abdominal tenderness. There is no guarding.  Musculoskeletal: Normal range of motion.        General: No tenderness or edema.  Lymphadenopathy:    She has no cervical adenopathy.  Neurological: She is alert and oriented to person, place, and time. No cranial nerve deficit. Coordination normal.  Skin: Skin is warm and dry. No rash noted. No erythema.   CT MAXILLOFACIAL W/O CONTRAST  10/15/2018:  IMPRESSION: Negative maxillofacial CT.  Visualized paranasal sinuses are clear.  U/S ABDOMEN COMPLETE 10/15/2018: IMPRESSION: 1. History of cirrhosis with no ascites or visible mass lesion. 2. Negative gallbladder. 3. History of partial  pancreatectomy with absent spleen.  U/S LIVER DOPPLER 10/23/2018:  IMPRESSION: Directed duplex of the hepatic vasculature unremarkable, with hepatopetal flow  BLOOD CULTURE 10/15/2018:  + Enterobacteriaceae species + Escherichia coli   URINE ANALYSIS 10/15/2018:  Cloudy urine with >50 RBC and WBC, proteinuria, Hyaline casts and mucus present. Few bacteria.   Assessment/Plan:  Principal Problem:   AKI (acute kidney injury) (HCC) Active Problems:   Liver cirrhosis (HCC)   Leukocytosis   Transaminitis   Diarrhea   Elevated troponin   Elevated d-dimer   Microscopic hematuria  AKI secondary to dehydration : Patient with 4 days of nausea/vomiting/diarrhea with poor PO intake and one syncopal episode yesterday. She was found to be hypotensive upon arrival to ED which improved with 3L bolus. Baseline SBP is ~100 mmHg. Baseline Cr <1. Cr 2.2 today. Ca 6.9 on morning labs with corrected Ca 7.8, repleted.  Patient receiving continuous IVF but remains hypotensive and tachycardic.   - Continue IV fluids  - Continue cardiac monitoring  - Monitor BMP for any electrolyte imbalances and replete as needed   Sepsis 2/2 E.coli bacteremia  Patient is hypotensive (SBP in 80s) and tachycardic (>100). WBC 15.8 on admission, 11.5 this AM. Patient denies any recent fever, chills, or sick contacts. Given patient's history of splenectomy, she is at increased risk for infection. She reports that she has not received Hib, PSV13/PPSV23, or meningitis in the past. UA had few bacteria present but blood culture positive for E.coli w/o resistant gene.  Given that she has E.coli bacteremia, that is likely the source of her hypotension and tachycardia. However, given that she has history of portal vein thrombosis s/p 6 mo of anticoagulation with coumadin, PE should be ruled out. Wells criteria 4.5. Will get V/Q scan as CTA contraindicated due to decreased renal function.   - F/u VQ scan - Start Rocephin 2g qd  -  Continue IV fluids  - Hib and PSV13 vaccines  - Continue to monitor vitals   Microscopic hematuria:  U/A with >50RBC and WBC, proteinuria, hyaline casts and mucus. Patient denies any gross hematuria. LMP was 3 weeks ago.   - Renal and bladder U/S   Transaminitis with Hx of liver cirrhosis and Hx of portal vein thrombosis  AST/ALT 108/56 this AM. Patient has history of liver cirrhosis. Patient was previously admitted in 2016 for acute liver failure in setting of long standing alcohol abuse. At the time, patient was admitted to ICU and had to be intubated for ARDS, resuscitated with fluids, transfused with FFP and vitamin K. She was noted to have thrombosis of portal vein and started on anticoagulation. She was stabilized and discharged with lactulose, coumadin with therapeutic INR.  Per patient, she was compliant prior to losing health insurance 2 years ago. Since then, she was ordering her medications online and titrated herself off the coumadin and also has not been taking spironolactone and lasix over past 2-3 months. Abdominal US consistent with history of cirrhosis but no ascites or visible mass noted.   - Continue to monitor   Elevated serum b-HCG Pt is sexually active. LMP 3 weeks ago. No contraceptive use. b-HCG 10.5. UPT negative.   -Repeat b-HCG in 2 days   Depression:  Patient with hx of depression on zoloft at home.  - Continue zoloft 50mg  qd.    DVT Prophylaxis: Lovenox Diet: Sodium Restricted  Code: Full code    Dispo: Anticipated discharge in approximately 2-3 day(s).   Harvie Heck, MD  Internal Medicine, PGY-1 10/15/2018, 3:32 PM Pager: [phone]

## 2018-10-16 DIAGNOSIS — R3129 Other microscopic hematuria: Secondary | ICD-10-CM

## 2018-10-16 DIAGNOSIS — N390 Urinary tract infection, site not specified: Secondary | ICD-10-CM

## 2018-10-16 DIAGNOSIS — F1010 Alcohol abuse, uncomplicated: Secondary | ICD-10-CM

## 2018-10-16 DIAGNOSIS — I81 Portal vein thrombosis: Secondary | ICD-10-CM

## 2018-10-16 DIAGNOSIS — G43909 Migraine, unspecified, not intractable, without status migrainosus: Secondary | ICD-10-CM

## 2018-10-16 DIAGNOSIS — R451 Restlessness and agitation: Secondary | ICD-10-CM

## 2018-10-16 DIAGNOSIS — N170 Acute kidney failure with tubular necrosis: Secondary | ICD-10-CM

## 2018-10-16 DIAGNOSIS — I959 Hypotension, unspecified: Secondary | ICD-10-CM

## 2018-10-16 LAB — BASIC METABOLIC PANEL
Anion gap: 13 (ref 5–15)
BUN: 29 mg/dL — ABNORMAL HIGH (ref 6–20)
CO2: 18 mmol/L — ABNORMAL LOW (ref 22–32)
Calcium: 7.5 mg/dL — ABNORMAL LOW (ref 8.9–10.3)
Chloride: 98 mmol/L (ref 98–111)
Creatinine, Ser: 2.28 mg/dL — ABNORMAL HIGH (ref 0.44–1.00)
GFR calc Af Amer: 29 mL/min — ABNORMAL LOW (ref >60)
GFR calc non Af Amer: 25 mL/min — ABNORMAL LOW (ref >60)
Glucose, Bld: 107 mg/dL — ABNORMAL HIGH (ref 70–99)
Potassium: 3.1 mmol/L — ABNORMAL LOW (ref 3.5–5.1)
Sodium: 129 mmol/L — ABNORMAL LOW (ref 135–145)

## 2018-10-16 LAB — CBC WITH DIFFERENTIAL/PLATELET
Abs Immature Granulocytes: 0.16 10*3/uL — ABNORMAL HIGH (ref 0.00–0.07)
Basophils Absolute: 0.1 10*3/uL (ref 0.0–0.1)
Basophils Relative: 1 %
Eosinophils Absolute: 0 10*3/uL (ref 0.0–0.5)
Eosinophils Relative: 0 %
HCT: 29.5 % — ABNORMAL LOW (ref 36.0–46.0)
Hemoglobin: 10.3 g/dL — ABNORMAL LOW (ref 12.0–15.0)
Immature Granulocytes: 1 %
Lymphocytes Relative: 6 %
Lymphs Abs: 0.7 10*3/uL (ref 0.7–4.0)
MCH: 34 pg (ref 26.0–34.0)
MCHC: 34.9 g/dL (ref 30.0–36.0)
MCV: 97.4 fL (ref 80.0–100.0)
Monocytes Absolute: 1.3 10*3/uL — ABNORMAL HIGH (ref 0.1–1.0)
Monocytes Relative: 10 %
Neutro Abs: 10.2 10*3/uL — ABNORMAL HIGH (ref 1.7–7.7)
Neutrophils Relative %: 82 %
Platelets: 99 10*3/uL — ABNORMAL LOW (ref 150–400)
RBC: 3.03 MIL/uL — ABNORMAL LOW (ref 3.87–5.11)
RDW: 16.2 % — ABNORMAL HIGH (ref 11.5–15.5)
WBC: 12.4 10*3/uL — ABNORMAL HIGH (ref 4.0–10.5)
nRBC: 0.3 % — ABNORMAL HIGH (ref 0.0–0.2)

## 2018-10-16 LAB — COMPREHENSIVE METABOLIC PANEL
ALT: 41 U/L (ref 0–44)
AST: 66 U/L — ABNORMAL HIGH (ref 15–41)
Albumin: 2.2 g/dL — ABNORMAL LOW (ref 3.5–5.0)
Alkaline Phosphatase: 121 U/L (ref 38–126)
Anion gap: 12 (ref 5–15)
BUN: 30 mg/dL — ABNORMAL HIGH (ref 6–20)
CO2: 17 mmol/L — ABNORMAL LOW (ref 22–32)
Calcium: 6.8 mg/dL — ABNORMAL LOW (ref 8.9–10.3)
Chloride: 102 mmol/L (ref 98–111)
Creatinine, Ser: 2.32 mg/dL — ABNORMAL HIGH (ref 0.44–1.00)
GFR calc Af Amer: 28 mL/min — ABNORMAL LOW (ref >60)
GFR calc non Af Amer: 24 mL/min — ABNORMAL LOW (ref >60)
Glucose, Bld: 83 mg/dL (ref 70–99)
Potassium: 3.1 mmol/L — ABNORMAL LOW (ref 3.5–5.1)
Sodium: 131 mmol/L — ABNORMAL LOW (ref 135–145)
Total Bilirubin: 0.7 mg/dL (ref 0.3–1.2)
Total Protein: 5 g/dL — ABNORMAL LOW (ref 6.5–8.1)

## 2018-10-16 LAB — VITAMIN D 25 HYDROXY (VIT D DEFICIENCY, FRACTURES): Vit D, 25-Hydroxy: 30.3 ng/mL (ref 30.0–100.0)

## 2018-10-16 LAB — MAGNESIUM
Magnesium: 0.6 mg/dL — CL (ref 1.7–2.4)
Magnesium: 2.2 mg/dL (ref 1.7–2.4)

## 2018-10-16 MED ORDER — PROCHLORPERAZINE EDISYLATE 10 MG/2ML IJ SOLN
10.0000 mg | Freq: Two times a day (BID) | INTRAMUSCULAR | Status: DC | PRN
  Filled 2018-10-16: qty 2

## 2018-10-16 MED ORDER — POTASSIUM CHLORIDE CRYS ER 20 MEQ PO TBCR
40.0000 meq | EXTENDED_RELEASE_TABLET | Freq: Two times a day (BID) | ORAL | Status: AC
  Administered 2018-10-16 – 2018-10-17 (×2): 40 meq via ORAL
  Filled 2018-10-16 (×2): qty 2

## 2018-10-16 MED ORDER — STERILE WATER FOR INJECTION IV SOLN
INTRAVENOUS | Status: DC
  Administered 2018-10-16 – 2018-10-17 (×2): via INTRAVENOUS
  Filled 2018-10-16 (×3): qty 9.71

## 2018-10-16 MED ORDER — MAGNESIUM SULFATE 4 GM/100ML IV SOLN
4.0000 g | Freq: Once | INTRAVENOUS | Status: AC
  Administered 2018-10-16: 4 g via INTRAVENOUS
  Filled 2018-10-16: qty 100

## 2018-10-16 MED ORDER — DIPHENHYDRAMINE HCL 50 MG/ML IJ SOLN
25.0000 mg | Freq: Two times a day (BID) | INTRAMUSCULAR | Status: DC | PRN
  Filled 2018-10-16: qty 1

## 2018-10-16 MED ORDER — CALCIUM CARBONATE 1250 (500 CA) MG PO TABS
1.0000 | ORAL_TABLET | Freq: Two times a day (BID) | ORAL | Status: DC
  Administered 2018-10-16 – 2018-10-17 (×3): 500 mg via ORAL
  Filled 2018-10-16 (×3): qty 1

## 2018-10-16 NOTE — Progress Notes (Signed)
Received page overnight from RN regarding pt's refusal of tele monitor. RN also communicated concerns for pt's agitation and possibility of withdrawal. Assessed pt at bedside. Pt did note that she was willing to wear the small tele box, just did not want to wear large tele unit. Pt frustrated as she blames this on reason for urine incontinence while on the way to restroom. I related the importance and reason of continued telemetry. Patient did not appear agitated while I was in room. She did not exhibit tremor and anxious affect while in room.  She also complained of some left sided flank pain that wraps around to her groin. Likely related to E. Coli UTI/bacteremia. Currently on rocephin. Continues to be afebrile. Pt reports that her nasal drainage and n/v have resolved and is feeling better than last night.

## 2018-10-16 NOTE — Progress Notes (Signed)
  Date: 10/16/2018  Patient name: Meredith Cohen  Medical record number: [id number]  Date of birth: 1970-07-25   I have seen and evaluated this patient and I have discussed the plan of care with the house staff. Please see Dr. Margy Clarks note for complete details. I concur with her findings with the following additions: Would repeat blood HCG tomorrow morning to check for doubling.  Continue IV antibiotics until specificities result.    Sid Falcon, MD 10/16/2018, 2:22 PM

## 2018-10-16 NOTE — Progress Notes (Signed)
Around 00:10 this RN received call from 5W RN that patient went there and that she was redirected to her room. When this RN went to check on pt,she had paper towel pressed on her left FA where she had the IV. " I forgot that I have it" IV was out and was on top of IV pole. Cleaned left FA and informed her that she needs to get another IV so she can get her IVF. Patient agreeable. IV team RN notified. Maryuri Warnke, Wonda Cheng, Therapist, sports

## 2018-10-16 NOTE — Progress Notes (Addendum)
Subjective: Patient examined at bedside this morning by team. She complains of migraine this morning and reports that she usually takes advil for this. She reports that the Fioricet or acetaminophen did not help. Patient does not report of any other discomfort. Female friend at bedside. Patient and friend were informed of E.coli bacteremia and treatment plan.   Objective:  Vital signs in last 24 hours: Vitals:   10/15/18 1345 10/15/18 1616 10/15/18 2053 10/16/18 0500  BP: (!) 88/70 92/61 103/65 115/70  Pulse: (!) 107 98 (!) 103 99  Resp:  18 19 (!) 21  Temp:  98.8 F (37.1 C) 98.7 F (37.1 C) 98.5 F (36.9 C)  TempSrc:  Oral Oral Oral  SpO2: 99% 97% 96% 99%  Weight:   56.5 kg   Height:       Physical Exam  Constitutional: She is oriented to person, place, and time and well-developed, well-nourished, and in no distress.  Cardiovascular: Normal rate, normal heart sounds and intact distal pulses. Exam reveals no gallop and no friction rub.  No murmur heard. Pulmonary/Chest: Effort normal and breath sounds normal. No respiratory distress. She has no wheezes. She has no rales.  Abdominal: Soft. Bowel sounds are normal. She exhibits no distension. There is no abdominal tenderness. There is no guarding.  Musculoskeletal: Normal range of motion.        General: No tenderness or edema.  Neurological: She is alert and oriented to person, place, and time. No cranial nerve deficit.   V/Q SCAN 10/15/2018:  IMPRESSION: No evidence of acute pulmonary embolism  U/S PELVIS 10/15/2018: IMPRESSION: Normal bladder ultrasound.  U/S ABDOMEN COMPLETE 10/15/2018: Right Kidney: Length: 11.3 cm. Echogenicity within normal limits. No mass or hydronephrosis visualized. Left Kidney: Length: 10.9 cm. Echogenicity within normal limits. No mass or hydronephrosis visualized. IMPRESSION: 1. History of cirrhosis with no ascites or visible mass lesion. 2. Negative gallbladder. 3. History of partial  pancreatectomy with absent spleen.  Assessment/Plan:  Principal Problem:   AKI (acute kidney injury) ([place]) Active Problems:   Liver cirrhosis (HCC)   Leukocytosis   Transaminitis   Diarrhea   Elevated troponin   Elevated d-dimer   Microscopic hematuria  E.coli bacteremia  Patient presented to ED after syncopal episode following 3-4 days of n/v/d. Hypotensive and tachycardic on presentation. BP improved to baseline (SBP ~100) with fluid resuscitation. Patient remains slightly tachycardic this morning (around 100).  Found to have E.coli bacteremia. WBC elevated at 12.4, neutrophil predominant. Patient remains afebrile. Patient on Rocephin day 2.  V/Q scan without evidence of acute pulmonary embolism.   - Continue Rocephin 2g qd  - F/u E.coli antibiotic sensitivities  - Continue IV fluids  - Continue to monitor vitals and CBC  Acute tubular necrosis 2/2 hypotension: Patient with 4 days of nausea/vomiting/diarrhea with poor PO intake and one syncopal episode on 10/14/2018. She was found to be hypotensive upon arrival to ED which improved with fluids. Baseline Cr <1. Cr 2.3 today. UA with >50 RBC, WBC, proteinuria, hyaline casts and mucus.  Ca 6.8 on morning labs with corrected Ca 8.2, patient started on calcium carbonate bid.  K 3.1 on morning labs. Patient started on KDUR 40 bid.  Bicarb 17 this morning. Patient fluids switched to NS w/NaHCO3 45mEq.   - Continue IV fluids  - Continue cardiac monitoring  - Monitor BMP for any electrolyte imbalances and replete as needed   Migraine Patient complains of migraine this morning. She reports usually taking advil/ibuprofen at home and  reports that the Tylenol or the Fioricet she received did not adequately control her headache. She reports that pain is radiating down neck and shoulder. Neuro exam wnl.   - Benadryl and compazine bid prn   Transaminitis with Hx of liver cirrhosis and Hx of portal vein thrombosis  Patient has history of  liver cirrhosis. Patient was previously admitted in 2016 for acute liver failure in setting of long standing alcohol abuse. At the time, patient was admitted to ICU and had to be intubated for ARDS, resuscitated with fluids, transfused with FFP and vitamin K. She was noted to have thrombosis of portal vein and started on anticoagulation. She was stabilized and discharged with lactulose, coumadin with therapeutic INR.  Per patient, she was compliant prior to losing health insurance 2 years ago. Since then, she was ordering her medications online and titrated herself off the coumadin and also has not been taking spironolactone and lasix over past 2-3 months. Abdominal US consistent with history of cirrhosis but no ascites or visible mass noted.  AST/ALT 66/41 this AM, improved from yesterday. No signs of decompensation.   ADDENDUM: Pt refused lovenox previous night as she believed it was warfarin. She reports she does not want to take any warfarin as she has previously weaned herself off of it. Patient informed that lovenox is heparin product and is recommended for hospitalized patients to decrease chances of DVT. She expressed understanding and agreed to take it.   - Continue to monitor   Asplenia:  Given patient's Hx of splenectomy, increased risk for infections. Patient has not been previously vaccinated for Hemophilis influenza, Strep pneumoniae, or meningicoccal bacteria. Discussed with patient and she is agreeable to starting vaccinations in hospital and following up to get remaining doses with PCP.   - Hib and PSV13 vaccines   Elevated serum b-HCG Pt is sexually active. LMP 3 weeks ago. No contraceptive use. b-HCG 10.5. UPT negative.   -Repeat b-HCG in 2 days  Microscopic hematuria:  U/A with >50RBC and WBC, proteinuria, hyaline casts and mucus. Patient denies any gross hematuria. LMP was 3 weeks ago. Renal and bladder ultrasound unremarkable for any hydronephrosis, renal or bladder etiology  of hematuria.This is likely due to the ATN. Will continue IV fluids.   -Continue IVF as above  Dispo: Anticipated discharge in approximately 2-3 day(s).   Eliezer BottomAslam, Tiare Rohlman, MD  Internal Medicine, PGY-1 10/16/2018, 10:43 AM Pager: [phone]

## 2018-10-17 DIAGNOSIS — Z91048 Other nonmedicinal substance allergy status: Secondary | ICD-10-CM

## 2018-10-17 LAB — CULTURE, BLOOD (ROUTINE X 2): Special Requests: ADEQUATE

## 2018-10-17 LAB — CBC WITH DIFFERENTIAL/PLATELET
Abs Immature Granulocytes: 0.21 10*3/uL — ABNORMAL HIGH (ref 0.00–0.07)
Basophils Absolute: 0.1 10*3/uL (ref 0.0–0.1)
Basophils Relative: 1 %
Eosinophils Absolute: 0.1 10*3/uL (ref 0.0–0.5)
Eosinophils Relative: 1 %
HCT: 29 % — ABNORMAL LOW (ref 36.0–46.0)
Hemoglobin: 10.3 g/dL — ABNORMAL LOW (ref 12.0–15.0)
Immature Granulocytes: 1 %
Lymphocytes Relative: 6 %
Lymphs Abs: 1 10*3/uL (ref 0.7–4.0)
MCH: 33.9 pg (ref 26.0–34.0)
MCHC: 35.5 g/dL (ref 30.0–36.0)
MCV: 95.4 fL (ref 80.0–100.0)
Monocytes Absolute: 1.6 10*3/uL — ABNORMAL HIGH (ref 0.1–1.0)
Monocytes Relative: 10 %
Neutro Abs: 14.2 10*3/uL — ABNORMAL HIGH (ref 1.7–7.7)
Neutrophils Relative %: 81 %
Platelets: 70 10*3/uL — ABNORMAL LOW (ref 150–400)
RBC: 3.04 MIL/uL — ABNORMAL LOW (ref 3.87–5.11)
RDW: 16.3 % — ABNORMAL HIGH (ref 11.5–15.5)
WBC: 17.3 10*3/uL — ABNORMAL HIGH (ref 4.0–10.5)
nRBC: 0.3 % — ABNORMAL HIGH (ref 0.0–0.2)

## 2018-10-17 LAB — PTH, INTACT AND CALCIUM
Calcium, Total (PTH): 6.2 mg/dL — CL (ref 8.7–10.2)
PTH: 12 pg/mL — ABNORMAL LOW (ref 15–65)

## 2018-10-17 LAB — COMPREHENSIVE METABOLIC PANEL
ALT: 33 U/L (ref 0–44)
AST: 50 U/L — ABNORMAL HIGH (ref 15–41)
Albumin: 2.2 g/dL — ABNORMAL LOW (ref 3.5–5.0)
Alkaline Phosphatase: 266 U/L — ABNORMAL HIGH (ref 38–126)
Anion gap: 14 (ref 5–15)
BUN: 31 mg/dL — ABNORMAL HIGH (ref 6–20)
CO2: 16 mmol/L — ABNORMAL LOW (ref 22–32)
Calcium: 7.9 mg/dL — ABNORMAL LOW (ref 8.9–10.3)
Chloride: 102 mmol/L (ref 98–111)
Creatinine, Ser: 1.85 mg/dL — ABNORMAL HIGH (ref 0.44–1.00)
GFR calc Af Amer: 37 mL/min — ABNORMAL LOW (ref >60)
GFR calc non Af Amer: 32 mL/min — ABNORMAL LOW (ref >60)
Glucose, Bld: 92 mg/dL (ref 70–99)
Potassium: 3.4 mmol/L — ABNORMAL LOW (ref 3.5–5.1)
Sodium: 132 mmol/L — ABNORMAL LOW (ref 135–145)
Total Bilirubin: 0.7 mg/dL (ref 0.3–1.2)
Total Protein: 5.3 g/dL — ABNORMAL LOW (ref 6.5–8.1)

## 2018-10-17 LAB — URINE CULTURE: Culture: >=100000 — AB

## 2018-10-17 LAB — HCG, QUANTITATIVE, PREGNANCY: hCG, Beta Chain, Quant, S: 4 m[IU]/mL (ref <5)

## 2018-10-17 LAB — PHOSPHORUS: Phosphorus: <1 mg/dL — CL (ref 2.5–4.6)

## 2018-10-17 MED ORDER — CEFAZOLIN SODIUM-DEXTROSE 2-4 GM/100ML-% IV SOLN
2.0000 g | Freq: Three times a day (TID) | INTRAVENOUS | Status: DC
  Filled 2018-10-17 (×2): qty 100

## 2018-10-17 MED ORDER — K PHOS MONO-SOD PHOS DI & MONO 155-852-130 MG PO TABS: 250.0000 mg | ORAL_TABLET | Freq: Two times a day (BID) | ORAL | 0 refills | Status: AC

## 2018-10-17 MED ORDER — POTASSIUM PHOSPHATES 15 MMOLE/5ML IV SOLN
30.0000 mmol | Freq: Once | INTRAVENOUS | Status: DC
  Filled 2018-10-17: qty 10

## 2018-10-17 MED ORDER — K PHOS MONO-SOD PHOS DI & MONO 155-852-130 MG PO TABS: 250.0000 mg | ORAL_TABLET | Freq: Two times a day (BID) | ORAL | 0 refills | Status: DC

## 2018-10-17 MED ORDER — CEPHALEXIN 500 MG PO CAPS: 1000.0000 mg | ORAL_CAPSULE | Freq: Two times a day (BID) | ORAL | 0 refills | Status: DC

## 2018-10-17 MED ORDER — CEPHALEXIN 500 MG PO CAPS: 1000.0000 mg | ORAL_CAPSULE | Freq: Two times a day (BID) | ORAL | 0 refills | Status: AC

## 2018-10-17 NOTE — Discharge Instructions (Signed)
Meredith Cohen, Meredith Cohen were admitted to the hospital after multiple days of nausea, vomiting, diarrhea and a syncopal episode. On labs, you were found to have bacteria in your blood, electrolyte imbalances, and decreased kidney function. You were started on antibiotics, fluids, and electrolyte imbalances were corrected. Please take the following medications on discharge:  Keflex 1000mg  (2 capsules of 500mg ) 2x/day for 10 days K Phos 1 tablet (250mg ) 2x/day for 7 days   Please continue to hydrate with water and Pedialyte to continue to heal your kidneys.  Please monitor for any signs of fever, fatigue, persistent nausea/vomiting, or dizziness.   Please follow up at Internal Medicine Clinic at [facility] on 10/23/2018 at 2:15pm for hospital follow up.

## 2018-10-17 NOTE — Discharge Summary (Signed)
Name: Meredith BergerRachel Cohen MRN: [id number] DOB: 1970/03/25 48 y.o. PCP: Chevis PrettyMasoudi, Elhamalsadat, MD  Date of Admission: 10/14/2018  7:16 PM Date of Discharge:  Attending Physician: Inez CatalinaMullen, Emily B, MD  Discharge Diagnosis: 1. E. Coli bacteremia 2. ATN  Discharge Medications: Allergies as of 10/17/2018      Reactions   Adhesive [tape] Rash   bandaids - skin welts   Penicillins Itching   Has patient had a PCN reaction causing immediate rash, facial/tongue/throat swelling, SOB or lightheadedness with hypotension: No Has patient had a PCN reaction causing severe rash involving mucus membranes or skin necrosis: No Has patient had a PCN reaction that required hospitalization: No Has patient had a PCN reaction occurring within the last 10 years: No If all of the above answers are "NO", then may proceed with Cephalosporin use.      Medication List    STOP taking these medications   gabapentin 300 MG capsule Commonly known as: NEURONTIN   lidocaine 5 % Commonly known as: LIDODERM   methocarbamol 500 MG tablet Commonly known as: ROBAXIN   ranitidine 75 MG tablet Commonly known as: ZANTAC     TAKE these medications   acetaminophen 325 MG tablet Commonly known as: TYLENOL Take 2 tablets (650 mg total) by mouth every 6 (six) hours as needed for mild pain (or Fever >/= 101).   Alka-Seltzer Pls Allergy & Cgh 5-6.25-10-325 MG Caps Generic drug: Phenyleph-Doxylamine-DM-APAP Take 1 capsule by mouth 2 (two) times daily as needed (for cough and congestion).   cephALEXin 500 MG capsule Commonly known as: KEFLEX Take 2 capsules (1,000 mg total) by mouth 2 (two) times daily for 10 days.   GNP Flu Relief Therapy Night 12.5-5-325 MG/15ML Liqd Generic drug: diphenhydrAMINE-PE-APAP Take 15 mLs by mouth 2 (two) times daily as needed (for cough and congestion).   HYDROmorphone 2 MG tablet Commonly known as: DILAUDID Take 1-2 tablets (2-4 mg total) by mouth every 4 (four) hours as needed for  moderate pain or severe pain.   ondansetron 4 MG tablet Commonly known as: ZOFRAN Take 1 tablet (4 mg total) by mouth every 6 (six) hours.   oxymetazoline 0.05 % nasal spray Commonly known as: AFRIN Place 1 spray into both nostrils 2 (two) times daily as needed for congestion.   phosphorus 155-852-130 MG tablet Commonly known as: K PHOS NEUTRAL Take 1 tablet (250 mg total) by mouth 2 (two) times daily.   sertraline 50 MG tablet Commonly known as: ZOLOFT Take 50 mg by mouth daily.       Disposition and follow-up:   [name] St[facility].  At the hospital follow up visit please address:   E.coli bacteremia Patient presented to ED after syncopal episode following 3-4 days of n/v/d. Found to have E.coli bacteremia. Started on Rocephin. Culture pansensitive. Patient remains afebrile but adament about going home. Patient discharged with Keflex 1000mg  bid for 10 days. Please f/u CBC and ensure patient is taking abx.   Acute tubular necrosis 2/2 hypotension: Patient with 4 days of nausea/vomiting/diarrhea with poor PO intake and one syncopal episodeon 10/14/2018. She was found to be hypotensive upon arrival to ED which improvedwith fluids.Baseline Cr <1. Cr 2.2 on admission, improved to 1.85 today. Electrolyte imbalances on BMP corrected. Patient to be discharged with K Phos. Please ensure patient is taking and f/u CMP.   Transaminitis with Hx of liver cirrhosisand Hx of portal vein thrombosis: Pt with Hx of alcohol induced liver  cirrhosis with prior admission for acute hepatic failure in 2016. Noted to have portal thrombosis and started on anticoagulation on discharge. Per patient, she has weaned herself off of warfarin. Abdominal US consistent with liver cirrhosis w/o ascites. Doppler US of liver with unremarkable findings. Transaminitis on this admission, improving. F/u LFTs. Encourage alcohol cessation. Follow up with  appropriate screening and management of cirrhosis.   Asplenia: Pt has hx of splenectomy and no history of vaccination. Pt given Hib and Prevnar during hospitalization.  Vaccines needed at this visit: Menactra, Bexsero for meningitis Vaccines needed in 2 months: Pneumovax 23, Menactra, Bexsero, Influenza vaccine Vaccines needed yearly: Influenza vaccine Vaccines needed in 5 years: Pneumovax 23, Menactra   2.  Labs / imaging needed at time of follow-up: CBC, CMP, vaccines  3.  Pending labs/ test needing follow-up: none   Follow-up Appointments: Follow-up Information    [facility]. Go to.   Specialty: Internal Medicine Why: 10/23/2018 at 2:15pm Contact information: [address] [id number] [place] [id number] [phone]       Chevis PrettyMasoudi, Elhamalsadat, MD. Schedule an appointment as soon as possible for a visit.   Specialty: Internal Medicine Contact information: [address] [place] [id number] [phone]           Hospital Course by problem list: E.coli bacteremia Patient presented to ED after syncopal episode following 3-4 days of n/v/d. Hypotensive and tachycardic on presentation. BP improved to baseline (SBP ~100) with fluid resuscitation. Tachycardia resolved.Found to have E.coli bacteremia. WBC 17.3 today . Patient expresses wishes to go home today. She remains afebrile. E. Coli pansensitive. Will discharge to home with Keflex 1000mg  bid for 10 days.   - Continue to monitorand f/u on CBC  Acute tubular necrosis 2/2 hypotension: Patient with 4 days of nausea/vomiting/diarrhea with poor PO intake and one syncopal episodeon 10/14/2018. She was found to be hypotensive upon arrival to ED which improvedwith fluids.Baseline Cr <1. Cr 2.2 on admission, improved to 1.85 today. Electrolyte imbalances on BMP corrected. Patient to be discharged with K Phos.   - F/u CMP   Transaminitis with Hx of liver  cirrhosisand Hx of portal vein thrombosis Patient has history of liver cirrhosis. Patient was previously admitted in 2016 for acute liver failure in setting of long standing alcohol abuse. At the time, patient was admitted to ICU and had to be intubated for ARDS, resuscitated with fluids, transfused with FFP and vitamin K.She was noted to have thrombosis of portal vein and started on anticoagulation. She was stabilized and discharged with lactulose, coumadin with therapeutic INR. Per patient, she was compliant prior to losing health insurance 2 years ago. Since then, she was ordering her medications online and titrated herself off the coumadin and also has not been taking spironolactone and lasix over past 2-3 months. Abdominal US consistent with history of cirrhosis but no ascites or visible mass noted. UDS positive for amphetamines although patient denied any recreational drug use. Given patient's hx of chronic nasal congestion, it is possible she could have used medication such as phenylephrine or pseudoephrine.  AST/ALT50/33 this AM, improved from yesterday. No signs of decompensation.  - Continue to monitor LFTs - Encourage alcohol and drug cessation  Asplenia:  Given patient's Hx of splenectomy, increased risk for infections. Patient has not been previously vaccinated for Hemophilis influenza, Strep pneumoniae, or meningicoccal bacteria. Discussed with patient and she is agreeable to starting vaccinations in hospital and following up to get remaining doses with PCP.Patient  received Hib and Prevnar during hospitalization.   - PPSV23 and meningitis vaccines as outpatient  - Age appropriate screening  Elevated serum b-HCG Pt is sexually active. LMP 3 weeks ago. No contraceptive use. b-HCG 10.5. UPT negative.  Repeat bHCG 4.0 this AM. Results conveyed to patient.   Microscopic hematuria:  U/A with >50RBC and WBC, proteinuria, hyaline casts and mucus. Patient denies any gross  hematuria. LMP was 3 weeks ago.Renal and bladder ultrasound unremarkable for any hydronephrosis, renal or bladder etiology of hematuria.This is likely due to the ATN.   -Encourage PO hydration   Discharge Vitals:   BP 102/66 (BP Location: Left Arm)   Pulse 80   Temp 98.2 F (36.8 C) (Oral)   Resp 18   Ht 5\' 6"  (1.676 m)   Wt 56.4 kg   LMP 09/30/2018 (Approximate)   SpO2 98%   BMI 20.07 kg/m   Pertinent Labs, Studies, and Procedures:  CXR 10/14/2018:  IMPRESSION: No active disease.  CT MAXILLOFACIAL WO CONTRAST 10/15/2018:  IMPRESSION: Negative maxillofacial CT. Visualized paranasal sinuses are clear.  US ABDOMEN COMPLETE 10/15/2018:  IMPRESSION: 1. History of cirrhosis with no ascites or visible mass lesion. 2. Negative gallbladder. 3. History of partial pancreatectomy with absent spleen.  US LIVER DOPPLER 10/15/2018:  IMPRESSION: Directed duplex of the hepatic vasculature unremarkable, with hepatopetal flow  US PELVIS 10/15/2018:  IMPRESSION: Normal bladder ultrasound.  V/Q SCAN 10/15/2018:  IMPRESSION: No evidence of acute pulmonary embolism  BLOOD CULTURE X2 10/15/2018:  + Enterobacteriaceae species + Escherichia coli   URINE ANALYSIS 10/15/2018:  Cloudy urine with >50 RBC and WBC, proteinuria, Hyaline casts and mucus present. Few bacteria.   URINE CULTURE 10/15/2018:  E. Coli pansensitive  Discharge Instructions: Ms. Sybella, Harnish were admitted to the hospital after multiple days of nausea, vomiting, diarrhea and a syncopal episode. On labs, you were found to have bacteria in your blood, electrolyte imbalances, and decreased kidney function. You were started on antibiotics, fluids, and electrolyte imbalances were corrected. Please take the following medications on discharge:  Keflex 1000mg  (2 capsules of 500mg ) 2x/day for 10 days K Phos 1 tablet (250mg ) 2x/day for 7 days   Please continue to hydrate with water and Pedialyte to continue to heal your kidneys.   Please monitor for any signs of fever, fatigue, persistent nausea/vomiting, or dizziness.   Please follow up at Internal Medicine Clinic at [facility] on 10/23/2018 at 2:15pm for hospital follow up.   Discharge Instructions    Call MD for:  extreme fatigue   Complete by: As directed    Call MD for:  persistant dizziness or light-headedness   Complete by: As directed    Call MD for:  persistant nausea and vomiting   Complete by: As directed    Call MD for:  temperature >100.4   Complete by: As directed    Diet - low sodium heart healthy   Complete by: As directed    Increase activity slowly   Complete by: As directed       Signed: Harvie Heck, MD  Internal Medicine, PGY-1 10/17/2018, 12:41 PM   Pager: [phone]

## 2018-10-17 NOTE — Progress Notes (Signed)
Subjective: Pt seen and examined at bedside this morning. No acute distress. No overnight events reported. Patient expressed that she needed to go home today and would take all prescriptions on discharge and follow up. Patient was recommended to stay another day due to electrolyte imbalances but she reports that she would prefer to leave today. Will discharge patient with oral antibiotics and K phos replacement.   Objective:  Vital signs in last 24 hours: Vitals:   10/16/18 1000 10/16/18 2100 10/17/18 0544 10/17/18 0925  BP: 99/68 101/69 108/76 102/66  Pulse: (!) 101 95 82 80  Resp: 20 19 18 18   Temp: 98.6 F (37 C) 98 F (36.7 C) 97.8 F (36.6 C) 98.2 F (36.8 C)  TempSrc: Oral Oral Oral Oral  SpO2: 98% 100% 96% 98%  Weight:  56.4 kg    Height:       Physical Exam  Constitutional: She is oriented to person, place, and time and well-developed, well-nourished, and in no distress.  Cardiovascular: Normal rate, regular rhythm, normal heart sounds and intact distal pulses. Exam reveals no gallop and no friction rub.  No murmur heard. Pulmonary/Chest: Effort normal and breath sounds normal. No respiratory distress. She has no wheezes. She has no rales. She exhibits no tenderness.  Abdominal: Soft. Bowel sounds are normal. She exhibits no distension and no mass. There is no abdominal tenderness. There is no guarding.  Musculoskeletal: Normal range of motion.        General: No tenderness, deformity or edema.  Neurological: She is alert and oriented to person, place, and time. No cranial nerve deficit.     Assessment/Plan:  Principal Problem:   AKI (acute kidney injury) ([facility]) Active Problems:   Liver cirrhosis (HCC)   Leukocytosis   Transaminitis   Diarrhea   Elevated troponin   Elevated d-dimer   Microscopic hematuria  E.coli bacteremia  Patient presented to ED after syncopal episode following 3-4 days of n/v/d. Hypotensive and tachycardic on presentation. BP improved to  baseline (SBP ~100) with fluid resuscitation. Tachycardia resolved. Found to have E.coli bacteremia. WBC 17.3 today . Patient expresses wishes to go home today. She remains afebrile. E. Coli pansensitive. Will discharge to home with Keflex 1000mg  bid for 10 days.   - Continue to monitor and f/u on CBC  Acute tubular necrosis 2/2 hypotension: Patient with 4 days of nausea/vomiting/diarrhea with poor PO intake and one syncopal episode on 10/14/2018. She was found to be hypotensive upon arrival to ED which improved with fluids. Baseline Cr <1. Cr 2.2 on admission, improved to 1.85 today.  Electrolyte imbalances on BMP corrected. Patient to be discharged with K Phos.   - F/u BMP   Migraine (resolved) Patient complained of migraine yesterday and was given benadryl and compazine prn. She reports that migraine resolved this morning.    Transaminitis with Hx of liver cirrhosisand Hx of portal vein thrombosis Patient has history of liver cirrhosis. Patient was previously admitted in 2016 for acute liver failure in setting of long standing alcohol abuse. At the time, patient was admitted to ICU and had to be intubated for ARDS, resuscitated with fluids, transfused with FFP and vitamin K.She was noted to have thrombosis of portal vein and started on anticoagulation. She was stabilized and discharged with lactulose, coumadin with therapeutic INR. Per patient, she was compliant prior to losing health insurance 2 years ago. Since then, she was ordering her medications online and titrated herself off the coumadin and also has not been taking  spironolactone and lasix over past 2-3 months. Abdominal US consistent with history of cirrhosis but no ascites or visible mass noted.  AST/ALT 50/33 this AM, improved from yesterday. No signs of decompensation.   - Continue to monitor LFTs - Encourage alcohol and drug cessation  Asplenia:  Given patient's Hx of splenectomy, increased risk for infections.  Patient has not been previously vaccinated for Hemophilis influenza, Strep pneumoniae, or meningicoccal bacteria. Discussed with patient and she is agreeable to starting vaccinations in hospital and following up to get remaining doses with PCP. Patient received Hib and Prevnar during hospitalization.   - PPSV23 andmeningitis vaccines as outpatient  - Age appropriate screening  Elevated serum b-HCG Pt is sexually active. LMP 3 weeks ago. No contraceptive use. b-HCG 10.5. UPT negative.  Repeat bHCG 4.0 this AM. Results conveyed to patient.   Microscopic hematuria:  U/A with >50RBC and WBC, proteinuria, hyaline casts and mucus. Patient denies any gross hematuria. LMP was 3 weeks ago.Renal and bladder ultrasound unremarkable for any hydronephrosis, renal or bladder etiology of hematuria.This is likely due to the ATN.   -Encourage PO hydration    Dispo: Anticipated discharge today.   Harvie Heck, MD  Internal Medicine, PGY-1  10/17/2018, 10:37 AM Pager: [phone]

## 2018-10-17 NOTE — Progress Notes (Signed)
  Date: 10/17/2018  Patient name: Meredith Cohen  Medical record number: [id number]  Date of birth: 12-May-1970   I have seen and evaluated this patient and I have discussed the plan of care with the house staff. Please see Dr. Margy Clarks note for complete details. I concur with her findings and plan for discharge. Patient was adamant about leaving.  She should have HFU appointment to ensure resolution of AKI.     Sid Falcon, MD 10/17/2018, 1:06 PM

## 2018-10-17 NOTE — Progress Notes (Signed)
Meredith Cohen to be discharged Home  per MD order. Discussed prescriptions and follow up appointments with the patient. Prescriptions given to patient; medication list explained in detail. Patient verbalized understanding.  Skin clean, dry and intact without evidence of skin break down, no evidence of skin tears noted. IV catheter discontinued intact. Site without signs and symptoms of complications. Dressing and pressure applied. Pt denies pain at the site currently. No complaints noted.  Patient free of lines, drains, and wounds.   An After Visit Summary (AVS) was printed and given to the patient. Patient escorted via wheelchair, and discharged home via private auto.  Shela Commons, RN

## 2018-10-23 ENCOUNTER — Telehealth: Payer: Self-pay | Admitting: *Deleted

## 2018-10-23 ENCOUNTER — Ambulatory Visit: Payer: Self-pay

## 2018-10-23 NOTE — Telephone Encounter (Signed)
Call to patient to reschedule patient.  No answer.  Unable to leave a message.   VM full.    Sander Nephew, RN 10/23/2018 3:15 PM.

## 2018-10-24 ENCOUNTER — Encounter: Payer: Self-pay | Admitting: Internal Medicine

## 2020-05-29 IMAGING — NM NM PULMONARY PERFUSION PARTICULATE
8 series · 8 of 8 positions shown · non-contrast
Comparison: Radiograph 10/14/2018

CLINICAL DATA: Viral illness. Positive D-dimer. Concern for
pulmonary embolism.

EXAM:
NUCLEAR MEDICINE PERFUSION LUNG SCAN
TECHNIQUE: Perfusion images were obtained in multiple projections after
intravenous injection of radiopharmaceutical.
Ventilation scans intentionally deferred if perfusion scan and chest
x-ray adequate for interpretation during COVID 19 epidemic.
RADIOPHARMACEUTICALS:  1.7 mCi Nc-NNm MAA IV

[Series 1: ant/post perf · 4.14mm/px · 1 of 1 slices shown (1 of 2)]
[im 1/1]
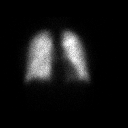

[Series 1: ant/post perf · 4.14mm/px · 1 of 1 slices shown (2 of 2)]
[im 1/1]
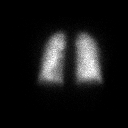

[Series 2: lao/rpo perf · 4.14mm/px · 1 of 1 slices shown (1 of 2)]
[im 1/1]
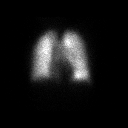

[Series 2: lao/rpo perf · 4.14mm/px · 1 of 1 slices shown (2 of 2)]
[im 1/1]
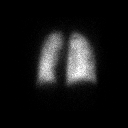

[Series 3: lpo/rao perf · 4.14mm/px · 1 of 1 slices shown (1 of 2)]
[im 1/1]
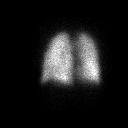

[Series 3: lpo/rao perf · 4.14mm/px · 1 of 1 slices shown (2 of 2)]
[im 1/1]
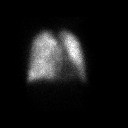

[Series 4: lt lat/rt lat perf · 4.14mm/px · 1 of 1 slices shown (1 of 2)]
[im 1/1]
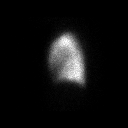

[Series 4: lt lat/rt lat perf · 4.14mm/px · 1 of 1 slices shown (2 of 2)]
[im 1/1]
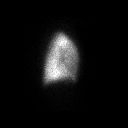

[8 of 8 positions shown; findings below may reference images not displayed]

FINDINGS: No wedge-shaped peripheral perfusion defects to localize acute
pulmonary embolism. Normal perfusion.
IMPRESSION: No evidence of acute pulmonary embolism
